# Patient Record
Sex: Male | Born: 1960 | Race: White | Hispanic: No | Marital: Married | Smoking: Current every day smoker
Health system: Southern US, Community
[De-identification: ages and names within clinical notes are randomized; demographics above are authoritative.]

---

## 2007-07-06 ENCOUNTER — Ambulatory Visit: Payer: Self-pay | Admitting: Family Medicine

## 2007-07-06 DIAGNOSIS — R030 Elevated blood-pressure reading, without diagnosis of hypertension: Secondary | ICD-10-CM

## 2007-07-06 DIAGNOSIS — M25539 Pain in unspecified wrist: Secondary | ICD-10-CM

## 2007-07-06 DIAGNOSIS — M542 Cervicalgia: Secondary | ICD-10-CM

## 2007-07-06 DIAGNOSIS — M66239 Spontaneous rupture of extensor tendons, unspecified forearm: Secondary | ICD-10-CM

## 2007-07-06 DIAGNOSIS — M66249 Spontaneous rupture of extensor tendons, unspecified hand: Secondary | ICD-10-CM

## 2009-02-20 ENCOUNTER — Ambulatory Visit: Payer: Self-pay | Admitting: Family Medicine

## 2009-02-20 DIAGNOSIS — M722 Plantar fascial fibromatosis: Secondary | ICD-10-CM

## 2010-04-16 ENCOUNTER — Ambulatory Visit: Payer: Self-pay | Admitting: Family Medicine

## 2010-04-16 DIAGNOSIS — M79609 Pain in unspecified limb: Secondary | ICD-10-CM

## 2010-04-16 DIAGNOSIS — J019 Acute sinusitis, unspecified: Secondary | ICD-10-CM

## 2010-06-26 NOTE — Assessment & Plan Note (Signed)
Summary: Sinusitis, Left arm pain   Vital Signs:  Patient profile:   50 year old male Height:      66 inches Weight:      180 pounds Temp:     98.2 degrees F oral Pulse rate:   80 / minute BP sitting:   128 / 78  (right arm) Cuff size:   regular  Vitals Entered By: Avon Gully CMA, Duncan Dull) (April 16, 2010 2:14 PM) CC: sinus congestion on and off x 6 weeks,feeling tired,cough   Primary Care Provider:  Linford Arnold, C  CC:  sinus congestion on and off x 6 weeks, feeling tired, and cough.  History of Present Illness: sinus congestion on and off x 6 weeks,feeling tired,cough. Started out with chest congestion adn chest pain but didn't really feelt that bad. Only last a day or so but since then has been sick on and off.  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  When first started felt very SOB.  Now that is better by still some chest congestion.  Then yesterday felt feverish.  Cough is productive.  NO cough meds.  Sometimes takes Allegra but not lately.  Took it early on but didn't really help. No HA. ST has resolved.     In the beginning of July he was pushing a 900 lb palette and hyperextended both arms.  Since then his left arm and elbow have been painful. He has diffuse bruising down to his forearm since this happened. He did report it to the medical person at work but they brushed it off. He thought it would get better on its own.    Current Medications (verified): 1)  Advil 200 Mg  Caps (Ibuprofen) .... Take Two Tablets By Janeece Agee Every Week  Allergies (verified): 1)  ! * Aleve 2)  Codeine  Comments:  Nurse/Medical Assistant: The patient's medications and allergies were reviewed with the patient and were updated in the Medication and Allergy Lists. Avon Gully CMA, Duncan Dull) (April 16, 2010 2:15 PM)  Social History: Reviewed history from 07/06/2007 and no changes required. 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  Partner is Tammy Crymes. Current Smoker Alcohol use-no Drug use-no Regular exercise-no  Physical Exam  General:  Well-developed,well-nourished,in no acute distress; alert,appropriate and cooperative throughout examination Head:  Normocephalic and atraumatic without obvious abnormalities. No apparent alopecia or balding. Eyes:  No corneal or conjunctival inflammation noted. EOMI. Perrla.  Ears:  External ear exam shows no significant lesions or deformities.  Otoscopic examination reveals clear canals, tympanic membranes are intact bilaterally without bulging, retraction, inflammation or discharge. Hearing is grossly normal bilaterally. Nose:  External nasal examination shows no deformity or inflammation. Nasal mucosa are pink and moist without lesions or exudates. Mouth:  Oral mucosa and oropharynx without lesions or exudates.  Teeth in good repair. Neck:  No deformities, masses, or tenderness noted. Chest Wall:  No deformities, masses, tenderness or gynecomastia noted. Lungs:  Normal respiratory effort, chest expands symmetrically. Lungs are clear to auscultation, no crackles or wheezes. Heart:  Normal rate and regular rhythm. S1 and S2 normal without gallop, murmur, click, rub or other extra sounds. Msk:  Left upper arm with a divet or gap betweent teh elbow crease and the bicep. Strength 5/5. No bruising or swelling today.   Skin:  no rashes.   Cervical Nodes:  No lymphadenopathy noted Psych:  Cognition and judgment appear intact. Alert and cooperative with normal attention span  and concentration. No apparent delusions, illusions, hallucinations   Impression & Recommendations:  Problem # 1:  SINUSITIS - ACUTE-NOS (ICD-461.9)  His updated medication list for this problem includes:    Amoxicillin 875 Mg Tabs (Amoxicillin) .Marland Kitchen... Take 1 tablet by mouth two times a day for 10 days  Instructed on treatment. Call if symptoms persist or worsen.   Problem # 2:  ARM PAIN, LEFT  (ICD-729.5) I actually think he has torn part of the bicep. I recommend he have further evaluation with [place] to confirm. Since the happened at work then he does need to be seen by workers comp if available through work. If not pt to call me back and I will make referall to sport med.   Complete Medication List: 1)  Advil 200 Mg Caps (Ibuprofen) .... Take two tablets by moutyh every week 2)  Amoxicillin 875 Mg Tabs (Amoxicillin) .... Take 1 tablet by mouth two times a day for 10 days  Patient Instructions: 1)  Call if not better in one week 2)  Call me if you want a referral for your left arm.  Prescriptions: AMOXICILLIN 875 MG TABS (AMOXICILLIN) Take 1 tablet by mouth two times a day for 10 days  #20 x 0   Entered and Authorized by:   Nani Gasser MD   Signed by:   Nani Gasser MD on 04/16/2010   Method used:   Electronically to        [facility]. [phone]* (retail)       [address]       [place], [place]  [address]       Ph: [phone]       Fax: [phone]   RxID:   [id number]    Orders Added: 1)  Est. Patient Level IV [36644]

## 2010-12-02 ENCOUNTER — Encounter: Payer: Self-pay | Admitting: Family Medicine

## 2010-12-06 ENCOUNTER — Encounter: Payer: Self-pay | Admitting: Family Medicine

## 2010-12-06 ENCOUNTER — Ambulatory Visit (INDEPENDENT_AMBULATORY_CARE_PROVIDER_SITE_OTHER): Payer: BC Managed Care – PPO | Admitting: Family Medicine

## 2010-12-06 VITALS — BP 140/86 | HR 89 | Ht 67.0 in | Wt 181.0 lb

## 2010-12-06 DIAGNOSIS — S43499A Other sprain of unspecified shoulder joint, initial encounter: Secondary | ICD-10-CM

## 2010-12-06 DIAGNOSIS — S46219A Strain of muscle, fascia and tendon of other parts of biceps, unspecified arm, initial encounter: Secondary | ICD-10-CM

## 2010-12-06 NOTE — Progress Notes (Signed)
  Subjective:    Patient ID: Brian Chan, male    DOB: 04/29/1961, 50 y.o.   MRN: [id number]  HPI  In the beginning of July he was pushing a 900 lb palette and hyperextended both arms. Since then his left arm and elbow have been painful. He has diffuse bruising down to his forearm since this happened. He did report it to the medical person at work but they brushed it off. He thought it would get better on its own. It feels asleep but not tingling in the upper arm.  If tries to open a jar it is ery difficulty. Will get cramping in his bicep. Even pulls somehting out of his pocket will have pain in his arm. Has full mobility of his arm. Not taking pain relievers.   Review of Systems     Objective:   Physical Exam    left Arm Pain- Lef shoulder and elbow and wrist with NROM.  Strength in the shoulder, elbow and hand is 5/5 bilat.  The bicpe looks rolled up into the arm.  Nontender over the shoulder and elbow.     Assessment & Plan:  Left bicpe tear - Will refer to ortho to see if amenable to surgical repair.   BP is up todate. Asked him to have ortho recheck it next week. If still elevated call and will start a BP medication.

## 2012-04-17 ENCOUNTER — Encounter: Payer: Self-pay | Admitting: Family Medicine

## 2012-04-17 ENCOUNTER — Telehealth: Payer: Self-pay | Admitting: *Deleted

## 2012-04-17 ENCOUNTER — Ambulatory Visit (INDEPENDENT_AMBULATORY_CARE_PROVIDER_SITE_OTHER): Payer: BC Managed Care – PPO | Admitting: Family Medicine

## 2012-04-17 ENCOUNTER — Ambulatory Visit (INDEPENDENT_AMBULATORY_CARE_PROVIDER_SITE_OTHER): Payer: BC Managed Care – PPO

## 2012-04-17 VITALS — BP 122/79 | HR 75 | Ht 67.0 in | Wt 168.0 lb

## 2012-04-17 DIAGNOSIS — R918 Other nonspecific abnormal finding of lung field: Secondary | ICD-10-CM

## 2012-04-17 DIAGNOSIS — R9389 Abnormal findings on diagnostic imaging of other specified body structures: Secondary | ICD-10-CM

## 2012-04-17 DIAGNOSIS — R05 Cough: Secondary | ICD-10-CM

## 2012-04-17 DIAGNOSIS — M79609 Pain in unspecified limb: Secondary | ICD-10-CM

## 2012-04-17 DIAGNOSIS — M25511 Pain in right shoulder: Secondary | ICD-10-CM

## 2012-04-17 DIAGNOSIS — M25519 Pain in unspecified shoulder: Secondary | ICD-10-CM

## 2012-04-17 DIAGNOSIS — M79646 Pain in unspecified finger(s): Secondary | ICD-10-CM

## 2012-04-17 MED ORDER — HYDROCODONE-ACETAMINOPHEN 5-325 MG PO TABS: 1.0000 | ORAL_TABLET | Freq: Two times a day (BID) | ORAL | Status: DC | PRN

## 2012-04-17 MED ORDER — MELOXICAM 15 MG PO TABS: 15.0000 mg | ORAL_TABLET | Freq: Every day | ORAL | Status: DC

## 2012-04-17 NOTE — Progress Notes (Signed)
Subjective:    Patient ID: Brian Chan, male    DOB: 01/05/1961, 51 y.o.   MRN: [id number]  HPI On a Monday work up wth scratchy throat, then next day could feel "it " in his chest. Then later that day worked in a dusty environment.  He did wear a dust mask. Then chest congestion got worse but couldn't cough anything up.  Then 6 days later went to ED because of SOB and chest discomfort. No fever. Had an xray.  Told had a spot on his left lung that he thought was infection.  Placed on an ABX  He completed course.  Says he is feeling better.  Also had a breathing tx at the ED as well and that really helped him. Still has some mild chest congestion.  Also given an albuterol inhaler.  Still no fever.   CXr done on 04/05/12. Reports non specific small opacity at the left cardiophrenic angle.   C/o of pain in his right thumb, right great toe, right shoulder and right hip.  Hx of old injury, dislocation of the right shoulder in 1989.  He is right handed.  Joints don't swell. Denies any sig stiffness in the AM.  No old injuries to the thumb. He is an Personnel officer for a living.  Says sleeps with his arm extended.  Pain when turns the steering wheel in his shoulder. When has pain in great toe, no swelling, heat or redness. He says he feels likt here is "something" in his joints.     Review of Systems     Objective:   Physical Exam  Constitutional: He is oriented to person, place, and time. He appears well-developed and well-nourished.  HENT:  Head: Normocephalic and atraumatic.  Right Ear: External ear normal.  Left Ear: External ear normal.  Nose: Nose normal.  Mouth/Throat: Oropharynx is clear and moist.       TMs and canals are clear.   Eyes: Conjunctivae normal and EOM are normal. Pupils are equal, round, and reactive to light.  Neck: Neck supple. No thyromegaly present.  Cardiovascular: Normal rate and normal heart sounds.   Pulmonary/Chest: Effort normal and breath sounds normal.    Musculoskeletal:       No swelling of the hands joints.  Mildly tender of the CMC joint.  Strength is 5/5 in the hands.  Shoulder with NROM.   Lymphadenopathy:    He has no cervical adenopathy.  Neurological: He is alert and oriented to person, place, and time.  Skin: Skin is warm and dry.  Psychiatric: He has a normal mood and affect.          Assessment & Plan:  Abnormal CXR - Will repeat chest x-ray today. There are no previous or old films for comparison. He then reports his last chest x-ray was well over 10 years ago.  Bronchitis-resolving. His exam is normal today. Recommend quit smoking.  He says he wants to quit but is not quite ready at this time.  Thumb and shoulder pain - discussed at this time his description I do not think that these are consistent with any type of rheumatologic disorder. Also does not sound consistent with gout. I suspect it's probably just osteoarthritis. Based on what he does for living it's not unusual to potentially have some significant arthritis at his age. I would like him to see my partner Dr. Benjamin Stain for further evaluation and treatment. Might even benefit from injection which would give him significant pain relief.  We have never treated his pain with narcotics that he has tried tramadol in the past. I did give him a small quantity of hydrocodone to get him through until he sees Dr. Benjamin Stain and I discussed alternative treatment regimens. I did discuss with him that narcotics is not an appropriate long-term treatment. I would also like him to try Mobic. 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 He normally takes it to 3 times a week and usually get some relief until more recently in which case he says he's taken up to 6 one time and has not gotten any relief.

## 2012-04-17 NOTE — Patient Instructions (Addendum)
Please schedule with Dr. Karie Schwalbe for your thumb and your shoulder We will call you with your xray results.

## 2012-04-17 NOTE — Telephone Encounter (Signed)
Patient notified of chest x-ray results and to f/u if not feeling better in 2 weeks Aware Rx sent to pharm

## 2012-04-20 ENCOUNTER — Telehealth: Payer: Self-pay | Admitting: *Deleted

## 2012-04-20 NOTE — Telephone Encounter (Signed)
Not sure what else to offer. We can try tramadol but I hthink he told me he had already tried it and felt it didn't really help him.

## 2012-04-20 NOTE — Telephone Encounter (Signed)
C/O reaction to  Hydrocodone- face was red and he was itching all over.  Instructed not to take anymore and wants something else sent to pharm.

## 2012-04-20 NOTE — Telephone Encounter (Signed)
Keep appt with Dr. Karie Schwalbe or schedule if needed. There are not other pain reliver taht are different from the mobic that I can offer him.

## 2012-04-20 NOTE — Telephone Encounter (Signed)
Patient states she has already tried the Tramadol and does not work for him.

## 2012-05-14 ENCOUNTER — Other Ambulatory Visit: Payer: Self-pay | Admitting: Family Medicine

## 2012-05-25 ENCOUNTER — Other Ambulatory Visit: Payer: Self-pay | Admitting: Family Medicine

## 2012-05-28 ENCOUNTER — Telehealth: Payer: Self-pay | Admitting: Family Medicine

## 2012-05-28 NOTE — Telephone Encounter (Signed)
Does patient need a referral to somewhere?? Brian Chan was saying he is ready to be scheduled for his referral???? Thanks, [employer]

## 2012-05-28 NOTE — Telephone Encounter (Signed)
Dr. Karie Schwalbe for thumb and shoulder pain.

## 2012-06-01 ENCOUNTER — Ambulatory Visit (INDEPENDENT_AMBULATORY_CARE_PROVIDER_SITE_OTHER): Payer: BC Managed Care – PPO | Admitting: Sports Medicine

## 2012-06-01 ENCOUNTER — Encounter: Payer: Self-pay | Admitting: Sports Medicine

## 2012-06-01 ENCOUNTER — Ambulatory Visit (INDEPENDENT_AMBULATORY_CARE_PROVIDER_SITE_OTHER): Payer: BC Managed Care – PPO

## 2012-06-01 VITALS — BP 122/77 | HR 73 | Ht 67.0 in | Wt 171.0 lb

## 2012-06-01 DIAGNOSIS — M25519 Pain in unspecified shoulder: Secondary | ICD-10-CM

## 2012-06-01 DIAGNOSIS — M25511 Pain in right shoulder: Secondary | ICD-10-CM | POA: Insufficient documentation

## 2012-06-01 DIAGNOSIS — M19049 Primary osteoarthritis, unspecified hand: Secondary | ICD-10-CM

## 2012-06-01 DIAGNOSIS — M1811 Unilateral primary osteoarthritis of first carpometacarpal joint, right hand: Secondary | ICD-10-CM | POA: Insufficient documentation

## 2012-06-01 MED ORDER — DICLOFENAC SODIUM 1 % TD GEL: 2.0000 g | Freq: Four times a day (QID) | TRANSDERMAL | Status: DC

## 2012-06-01 NOTE — Progress Notes (Addendum)
SPORTS MEDICINE CONSULTATION REPORT  Subjective:    I'm seeing this patient as a consultation for:  Dr. 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  Right thumb pain: Has been present for a long time, patient suspect arthritis. Has been using Mobic without much benefit. He did get some hydrocodone which provided some benefit the patient does understand that this was meant to be short term only. Pain is localized at the thumb basal joint and does not radiate. Worse with movement, better with rest. No swelling, worse when the weather is bad.  Right shoulder pain: Located over the deltoid, worse with overhead activities, waking him up from sleep, does not radiate, no numbness or tingling, moderate.  Past medical history, Surgical history, Family history, Social history, Allergies, and medications have been entered into the medical record, reviewed, and no changes needed.   Review of Systems: No headache, visual changes, nausea, vomiting, diarrhea, constipation, dizziness, abdominal pain, skin rash, fevers, chills, night sweats, weight loss, swollen lymph nodes, body aches, joint swelling, muscle aches, chest pain, shortness of breath, mood changes, visual or auditory hallucinations.   Objective:   Vitals:  Afebrile, vital signs stable. General: Well Developed, well nourished, and in no acute distress.  Neuro/Psych: Alert and oriented x3, extra-ocular muscles intact, able to move all 4 extremities.  Skin: Warm and dry, no rashes noted.  Respiratory: Not using accessory muscles, speaking in full sentences, trachea midline.  Cardiovascular: Pulses palpable, no extremity edema. Abdomen: Does not appear distended. Right Shoulder: Inspection reveals no abnormalities, atrophy or asymmetry. Palpation is normal with no tenderness over AC joint or bicipital groove. ROM is full in all  planes. Rotator cuff strength weak to abduction. Positive Neer's, Hawkin's, and empty can sign. Speeds and Yergason's tests normal. No labral pathology noted with negative Obrien's, negative clunk and good stability. Normal scapular function observed. No painful arc and no drop arm sign. No apprehension sign  Right wrist with pain at the thumb basal joint. Good movement. Negative Finkelstein's.  Impression and Recommendations:   This case required medical decision making of moderate complexity.

## 2012-06-01 NOTE — Assessment & Plan Note (Signed)
Symptoms insufficiently controlled with oral NSAIDs. He declines injection today. We'll use topical diclofenac, and obtain x-rays. He will come back to see [place] on an as-needed basis when he considers injection.

## 2012-06-01 NOTE — Assessment & Plan Note (Signed)
Symptoms are highly classic for rotator cuff syndrome. Continue meloxicam, on rehabilitation. Injection if no better.

## 2012-06-11 ENCOUNTER — Other Ambulatory Visit: Payer: Self-pay | Admitting: Family Medicine

## 2013-01-09 IMAGING — CR DG HAND COMPLETE 3+V*R*
3 series · 3 of 3 positions shown · non-contrast
Comparison: None.

CLINICAL DATA: Pain.

RIGHT HAND - COMPLETE 3+ VIEW

[view not recorded (1 of 3)]
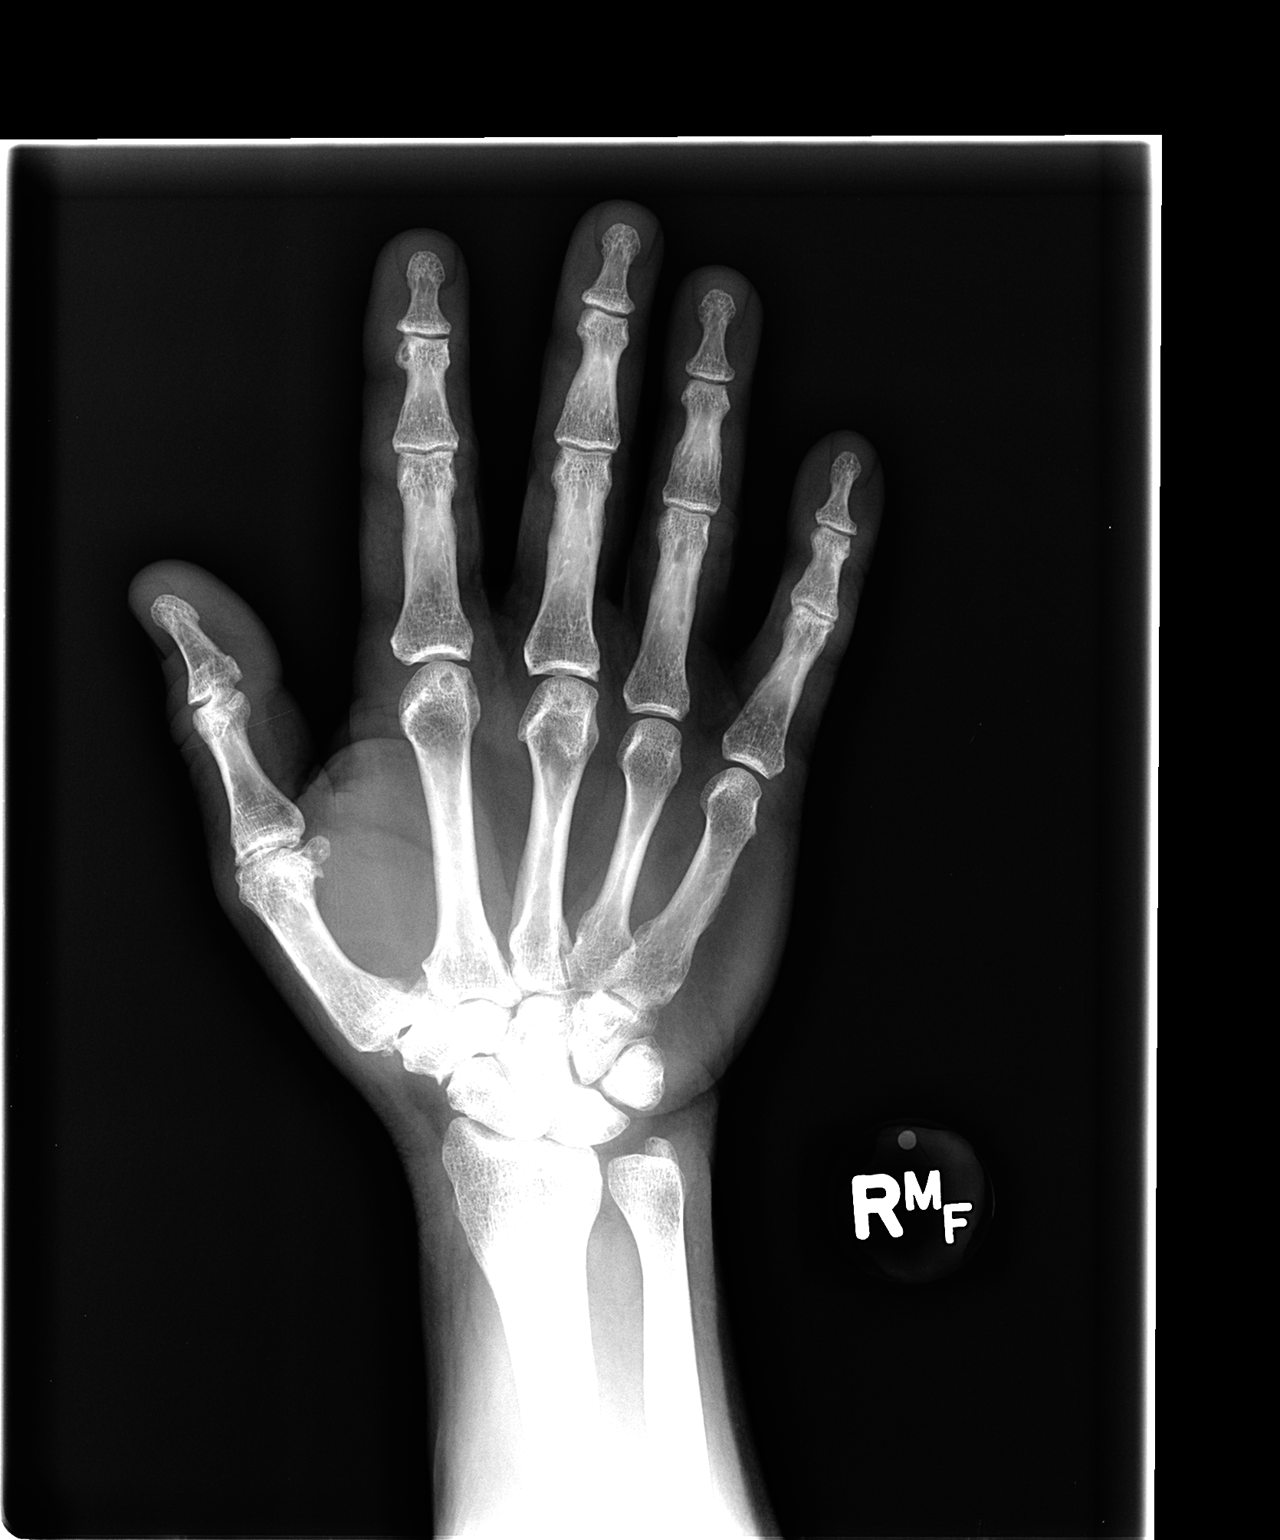

[view not recorded (2 of 3)]
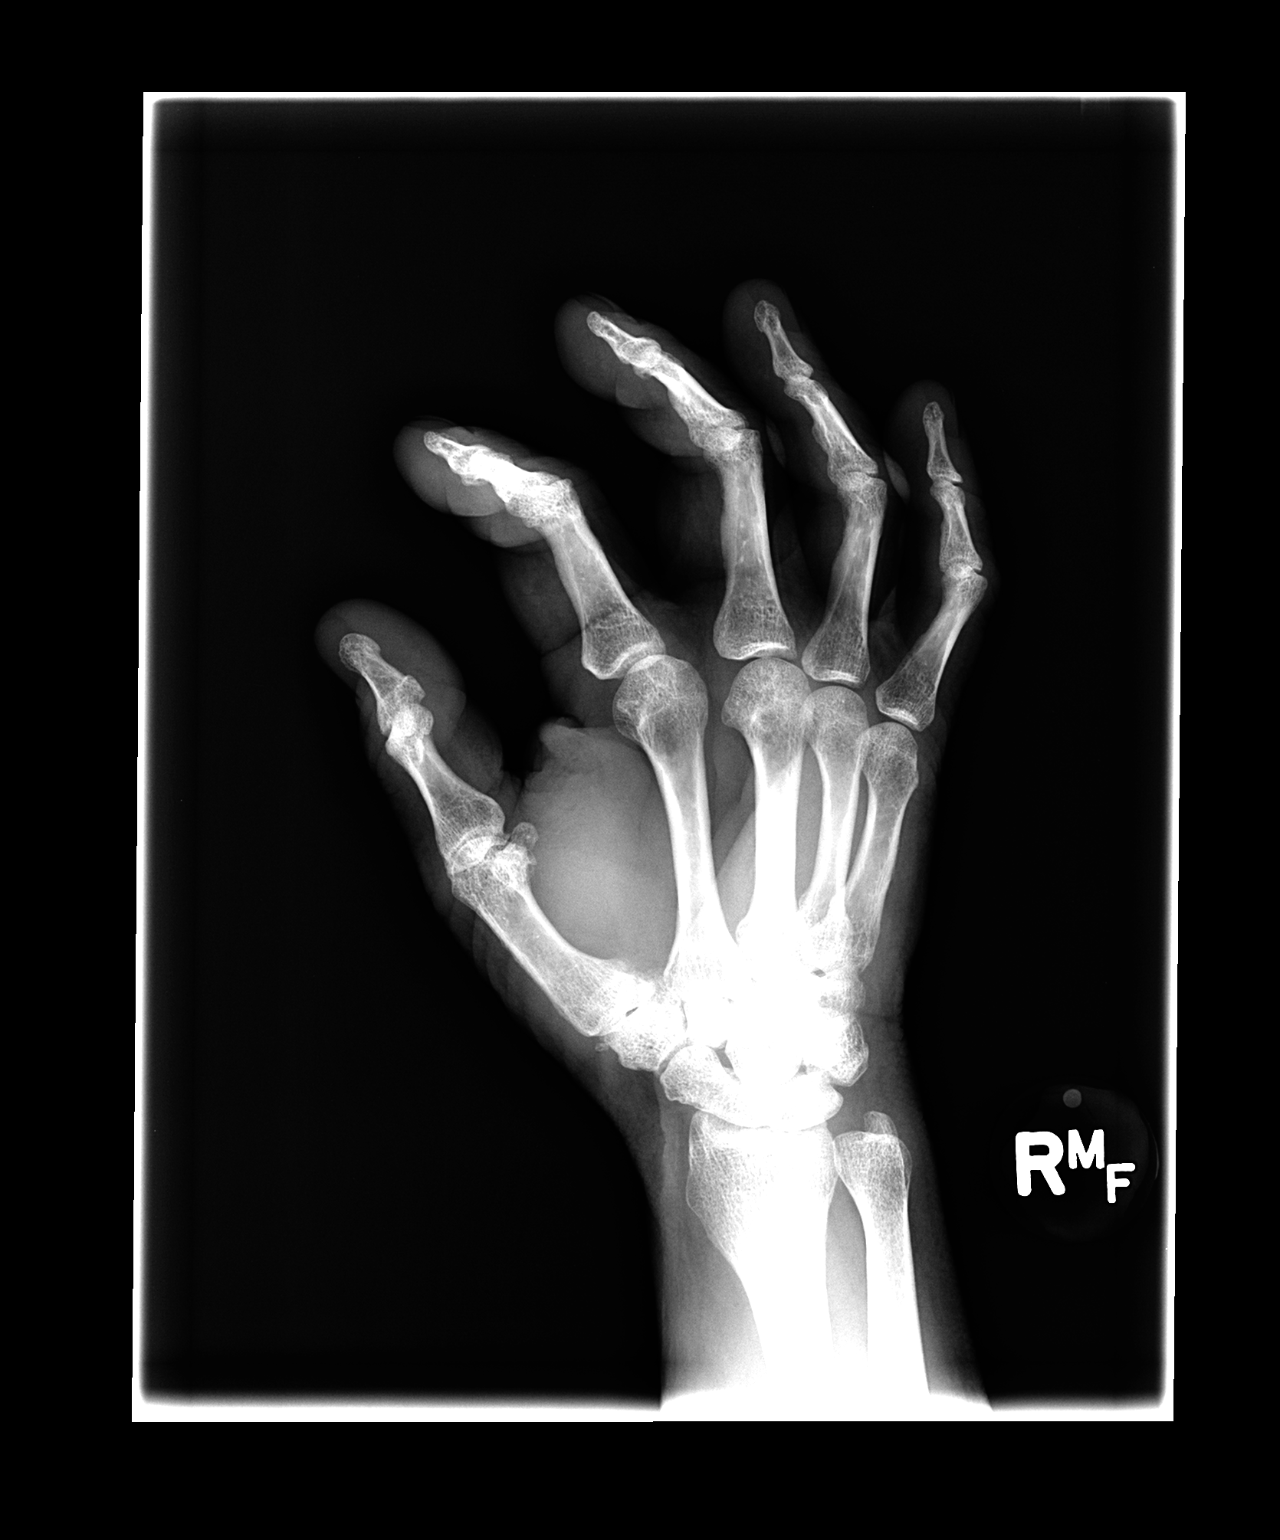

[view not recorded (3 of 3)]
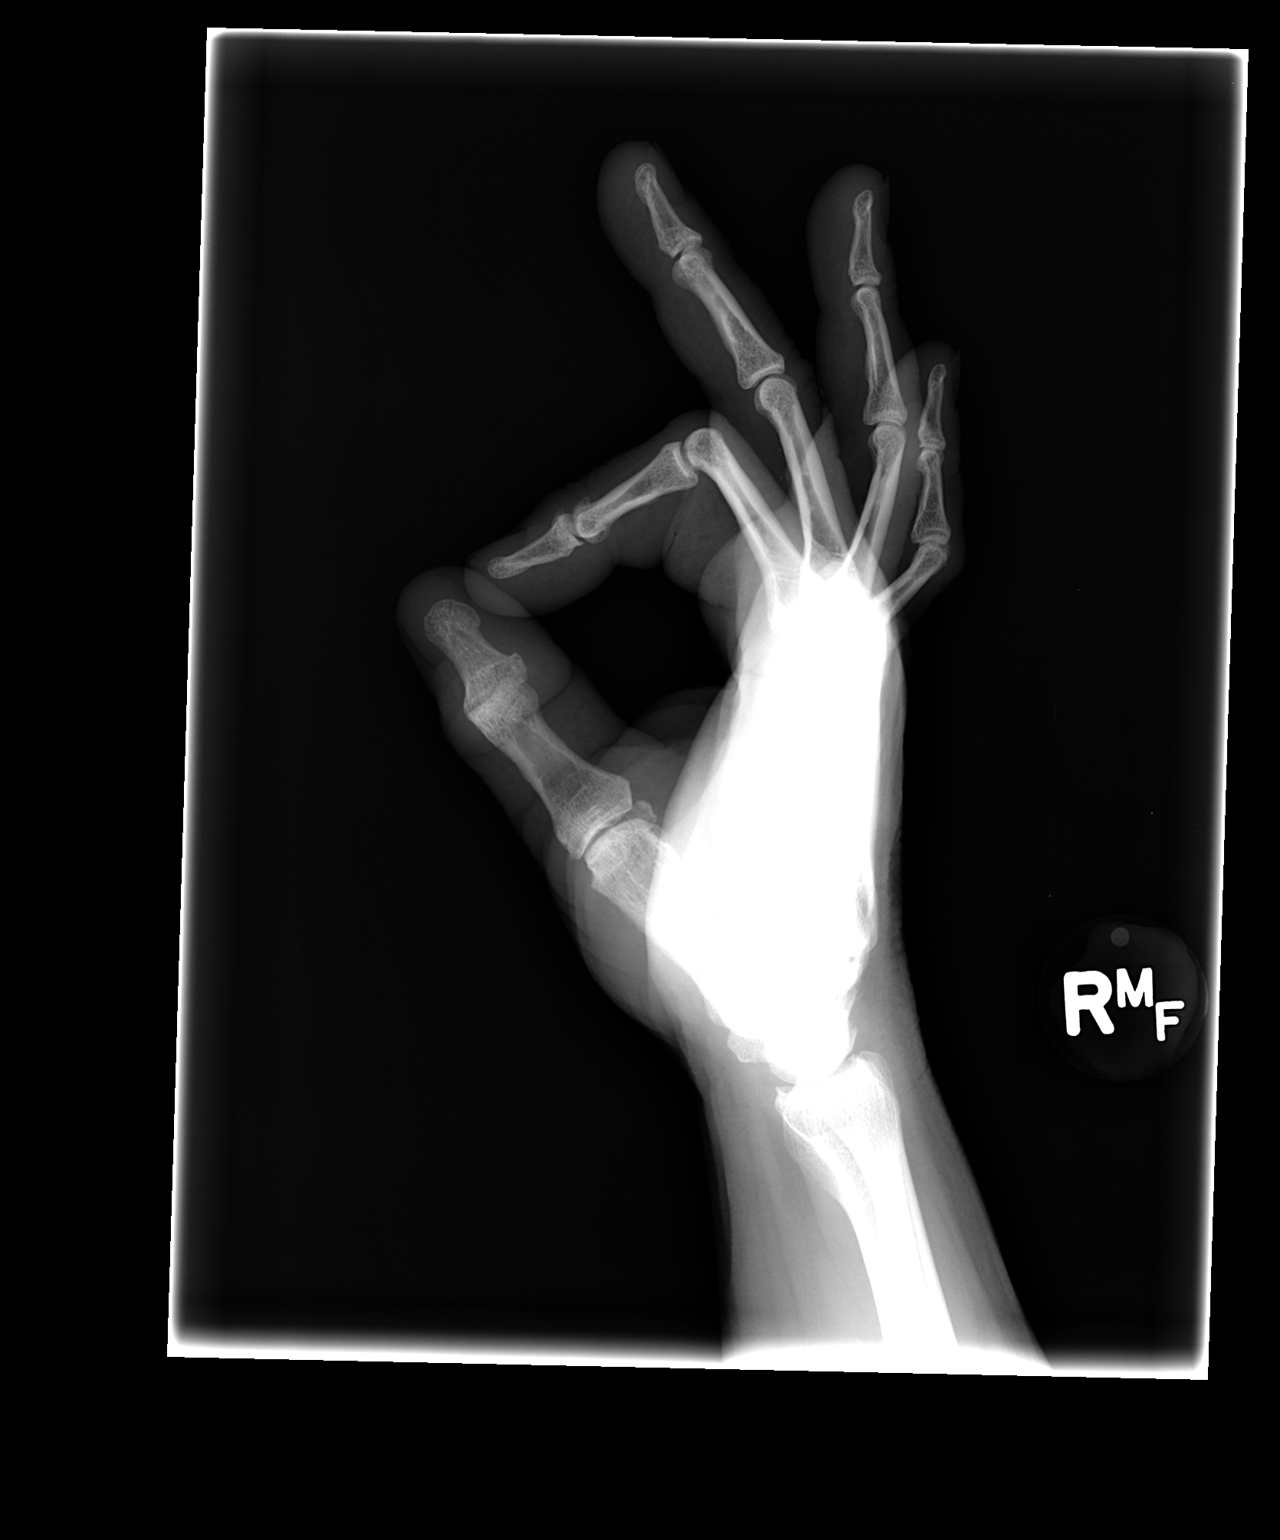

[3 of 3 positions shown; findings below may reference images not displayed]

FINDINGS: There is no acute bony or joint abnormality.  First CMC
degenerative change is noted. Degenerative disease is also seen at
the DIP joint of the index finger.  Soft tissue structures are
unremarkable.
IMPRESSION: No acute abnormality.  First CMC osteoarthritis.

## 2015-03-31 ENCOUNTER — Encounter: Payer: Self-pay | Admitting: Family Medicine

## 2015-03-31 ENCOUNTER — Ambulatory Visit (INDEPENDENT_AMBULATORY_CARE_PROVIDER_SITE_OTHER): Payer: PRIVATE HEALTH INSURANCE | Admitting: Family Medicine

## 2015-03-31 VITALS — BP 127/75 | HR 78 | Temp 98.7°F | Ht 67.0 in | Wt 167.7 lb

## 2015-03-31 DIAGNOSIS — R5383 Other fatigue: Secondary | ICD-10-CM

## 2015-03-31 DIAGNOSIS — M2550 Pain in unspecified joint: Secondary | ICD-10-CM | POA: Diagnosis not present

## 2015-03-31 LAB — COMPREHENSIVE METABOLIC PANEL
ALBUMIN: 4.2 g/dL (ref 3.6–5.1)
ALK PHOS: 92 U/L (ref 40–115)
ALT: 12 U/L (ref 9–46)
AST: 15 U/L (ref 10–35)
BILIRUBIN TOTAL: 0.5 mg/dL (ref 0.2–1.2)
BUN: 9 mg/dL (ref 7–25)
CALCIUM: 9 mg/dL (ref 8.6–10.3)
CO2: 26 mmol/L (ref 20–31)
Chloride: 103 mmol/L (ref 98–110)
Creat: 0.83 mg/dL (ref 0.70–1.33)
Glucose, Bld: 70 mg/dL (ref 65–99)
POTASSIUM: 4.2 mmol/L (ref 3.5–5.3)
SODIUM: 137 mmol/L (ref 135–146)
Total Protein: 6.7 g/dL (ref 6.1–8.1)

## 2015-03-31 LAB — CBC
HEMATOCRIT: 46 % (ref 39.0–52.0)
HEMOGLOBIN: 16 g/dL (ref 13.0–17.0)
MCH: 32.1 pg (ref 26.0–34.0)
MCHC: 34.8 g/dL (ref 30.0–36.0)
MCV: 92.2 fL (ref 78.0–100.0)
MPV: 8.9 fL (ref 8.6–12.4)
Platelets: 301 10*3/uL (ref 150–400)
RBC: 4.99 MIL/uL (ref 4.22–5.81)
RDW: 12.9 % (ref 11.5–15.5)
WBC: 16.6 10*3/uL — ABNORMAL HIGH (ref 4.0–10.5)

## 2015-03-31 LAB — CK: CK TOTAL: 84 U/L (ref 7–232)

## 2015-03-31 LAB — URIC ACID: Uric Acid, Serum: 3.8 mg/dL — ABNORMAL LOW (ref 4.0–7.8)

## 2015-03-31 LAB — TSH: TSH: 1.624 u[IU]/mL (ref 0.350–4.500)

## 2015-03-31 LAB — FERRITIN: Ferritin: 137 ng/mL (ref 22–322)

## 2015-03-31 LAB — C-REACTIVE PROTEIN: CRP: 1.6 mg/dL — AB (ref <0.60)

## 2015-03-31 MED ORDER — CYCLOBENZAPRINE HCL 10 MG PO TABS: 10.0000 mg | ORAL_TABLET | Freq: Three times a day (TID) | ORAL | Status: DC | PRN

## 2015-03-31 NOTE — Progress Notes (Signed)
Subjective:    Patient ID: Brian Chan, male    DOB: 22-Feb-1961, 54 y.o.   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 he says it is severe.  He says his collar bones are particularly painful. Taking IBU - not helping. . Pain is worse in the AM. Recently had to drill holes in a tight space.  Wrist and elbows are painful.  No URI sxs.  No family hx of RA, lupus, etc.  No fever, chills.  Hands get really stiff if drives ore than 45 minutes. Hips bother him most at night and in the evening. He says he's never had palms with his hips before until this last week. He denies any acute trauma or injury. He denies any atypical exposures to chemicals etc. at work recently.  Says when he first wakes up he is very stiff for about 1-2 hours. No swelling of the joints.  Says the last couple of nights not sleeping well.    He admits he has been more stressed financially recently. His work has cut back on over time which is really impacted his typical income and this is been somewhat stressful for him.  Review of Systems  BP 127/75 mmHg  Pulse 78  Temp(Src) 98.7 F (37.1 C)  Ht 5\' 7"  (1.702 m)  Wt 167 lb 11.2 oz (76.068 kg)  BMI 26.26 kg/m2  SpO2 100%    Allergies  Allergen Reactions  . Codeine     REACTION: itching and rash  . Naproxen Sodium     REACTION: Itching  . Hydrocodone-Acetaminophen Itching and Rash    No past medical history on file.  No past surgical history on file.  Social History   Social History  . Marital Status: Married    Spouse Name: N/A  . Number of Children: N/A  . Years of Education: N/A   Occupational History  . electrician    Social History Main Topics  . Smoking status: Current Every Day Smoker -- 1.00 packs/day    Types: Cigarettes  . Smokeless tobacco: Not on file  . Alcohol Use: No  . Drug Use: No  . Sexual Activity: Not on file   Other Topics Concern  . Not on file   Social History  Narrative    Family History  Problem Relation Age of Onset  . Cancer Mother   . Parkinsonism Father   . Heart attack      uncle  . Diabetes      grandmother    Outpatient Encounter Prescriptions as of 03/31/2015  Medication Sig  . cyclobenzaprine (FLEXERIL) 10 MG tablet Take 1 tablet (10 mg total) by mouth 3 (three) times daily as needed for muscle spasms.  . diclofenac sodium (VOLTAREN) 1 % GEL Apply 2 g topically 4 (four) times daily.  Marland Kitchen. HYDROcodone-acetaminophen (NORCO/VICODIN) 5-325 MG per tablet Take 1 tablet by mouth 2 (two) times daily as needed for pain.  Marland Kitchen. ibuprofen (ADVIL,MOTRIN) 200 MG tablet Take 400 mg by mouth every 7 (seven) days.    . meloxicam (MOBIC) 15 MG tablet TAKE 1 TABLET BY MOUTH EVERY DAY   No facility-administered encounter medications on file as of 03/31/2015.          Objective:   Physical Exam  Constitutional: He is oriented to person, place, and time. He appears well-developed and well-nourished.  HENT:  Head: Normocephalic and atraumatic.  Right Ear: External ear  normal.  Left Ear: External ear normal.  Nose: Nose normal.  Mouth/Throat: Oropharynx is clear and moist.  TMs and canals are clear.   Eyes: Conjunctivae and EOM are normal. Pupils are equal, round, and reactive to light.  Neck: Neck supple. No thyromegaly present.  Cardiovascular: Normal rate and normal heart sounds.   Pulmonary/Chest: Effort normal and breath sounds normal.  Musculoskeletal: He exhibits no edema.  Neck with normal flexion, extension, rotation right and left and side bending. Lumbar spine with normal flexion extension and rotation as well. He is nontender over the shoulders or wrists or hand joints. I don't see any distinct swelling. Strength is 5 out of 5 in the upper and lower extremities. Reflexes are 2+ in the upper and lower extremities as well. Gait is normal.  Lymphadenopathy:    He has no cervical adenopathy.  Neurological: He is alert and oriented to  person, place, and time.  Skin: Skin is warm and dry.  Psychiatric: He has a normal mood and affect.          Assessment & Plan:  Polyarthralgia-unclear etiology. I really suspect there something more going on than just plain osteoarthritis. The onset seems very abrupt and quite intense. Will check for inflammatory markers and rheumatoid arthritis with an anti-CCP though I don't see any actual swelling on exam. We'll also check uric acid level to evaluate for possible gout. Also check thyroid levels and CK. He is a little bit on the young side to have polymyalgia rheumatica but certainly that possible as well. Also check a CBC just to rule out infection. I did give him Flexeril as a trial over the weekend to see if this helps. He did take one of his wife's muscle relaxers last weekend and says it was helpful. The ibuprofen person is not really helping so he can discontinue that.

## 2015-04-01 LAB — SEDIMENTATION RATE: Sed Rate: 6 mm/hr (ref 0–20)

## 2015-04-03 LAB — CYCLIC CITRUL PEPTIDE ANTIBODY, IGG: Cyclic Citrullin Peptide Ab: <16 Units

## 2015-04-03 LAB — ANA: Anti Nuclear Antibody(ANA): NEGATIVE

## 2015-04-06 ENCOUNTER — Telehealth: Payer: Self-pay | Admitting: *Deleted

## 2015-04-06 DIAGNOSIS — M2550 Pain in unspecified joint: Secondary | ICD-10-CM

## 2015-04-06 DIAGNOSIS — R5383 Other fatigue: Secondary | ICD-10-CM

## 2015-04-06 NOTE — Telephone Encounter (Signed)
Pt called back and stated that he hasn't gotten any better although his results show everything is ok. He is still in pain and wanted to know if he should come back in. Will fwd to pcp for advice.Loralee PacasBarkley, Marysa Wessner [place]

## 2015-04-06 NOTE — Telephone Encounter (Signed)
We can recheck his CBC w/ diff and see if wbc are trending down.  Let have him make appt as well.   Let check for CMV, EBV, CBC w/ diff, TSH. b

## 2015-04-11 NOTE — Telephone Encounter (Signed)
Pt informed and will call back to schedule an appt. Labs ordered and faxed.Loralee PacasBarkley, Sajan [place]

## 2017-03-01 ENCOUNTER — Emergency Department (HOSPITAL_BASED_OUTPATIENT_CLINIC_OR_DEPARTMENT_OTHER)
Admission: EM | Admit: 2017-03-01 | Discharge: 2017-03-01 | Disposition: A | Discharge status: Home / Self Care | Payer: 59 | Attending: Emergency Medicine | Admitting: Emergency Medicine

## 2017-03-01 ENCOUNTER — Emergency Department (HOSPITAL_BASED_OUTPATIENT_CLINIC_OR_DEPARTMENT_OTHER): Payer: 59

## 2017-03-01 ENCOUNTER — Encounter (HOSPITAL_BASED_OUTPATIENT_CLINIC_OR_DEPARTMENT_OTHER): Payer: Self-pay | Admitting: Emergency Medicine

## 2017-03-01 DIAGNOSIS — R05 Cough: Secondary | ICD-10-CM | POA: Diagnosis present

## 2017-03-01 DIAGNOSIS — J209 Acute bronchitis, unspecified: Secondary | ICD-10-CM | POA: Insufficient documentation

## 2017-03-01 DIAGNOSIS — F17210 Nicotine dependence, cigarettes, uncomplicated: Secondary | ICD-10-CM | POA: Insufficient documentation

## 2017-03-01 DIAGNOSIS — J40 Bronchitis, not specified as acute or chronic: Secondary | ICD-10-CM

## 2017-03-01 LAB — BASIC METABOLIC PANEL
Anion gap: 4 — ABNORMAL LOW (ref 5–15)
BUN: 13 mg/dL (ref 6–20)
CALCIUM: 8.7 mg/dL — AB (ref 8.9–10.3)
CO2: 25 mmol/L (ref 22–32)
CREATININE: 0.89 mg/dL (ref 0.61–1.24)
Chloride: 108 mmol/L (ref 101–111)
GFR calc non Af Amer: >60 mL/min (ref >60)
Glucose, Bld: 93 mg/dL (ref 65–99)
Potassium: 4.1 mmol/L (ref 3.5–5.1)
SODIUM: 137 mmol/L (ref 135–145)

## 2017-03-01 LAB — CBC WITH DIFFERENTIAL/PLATELET
BASOS PCT: 0 %
Basophils Absolute: 0 10*3/uL (ref 0.0–0.1)
EOS ABS: 0.7 10*3/uL (ref 0.0–0.7)
Eosinophils Relative: 6 %
HCT: 43.7 % (ref 39.0–52.0)
Hemoglobin: 14.5 g/dL (ref 13.0–17.0)
Lymphocytes Relative: 29 %
Lymphs Abs: 3.4 10*3/uL (ref 0.7–4.0)
MCH: 31.6 pg (ref 26.0–34.0)
MCHC: 33.2 g/dL (ref 30.0–36.0)
MCV: 95.2 fL (ref 78.0–100.0)
MONOS PCT: 7 %
Monocytes Absolute: 0.9 10*3/uL (ref 0.1–1.0)
NEUTROS PCT: 58 %
Neutro Abs: 6.9 10*3/uL (ref 1.7–7.7)
PLATELETS: 275 10*3/uL (ref 150–400)
RBC: 4.59 MIL/uL (ref 4.22–5.81)
RDW: 13 % (ref 11.5–15.5)
WBC: 11.9 10*3/uL — AB (ref 4.0–10.5)

## 2017-03-01 LAB — TROPONIN I

## 2017-03-01 MED ORDER — AZITHROMYCIN 250 MG PO TABS: 250.0000 mg | ORAL_TABLET | Freq: Every day | ORAL | 0 refills | Status: DC

## 2017-03-01 MED ORDER — PREDNISONE 20 MG PO TABS: 20.0000 mg | ORAL_TABLET | Freq: Every day | ORAL | 0 refills | Status: AC

## 2017-03-01 MED ORDER — ALBUTEROL SULFATE HFA 108 (90 BASE) MCG/ACT IN AERS: 1.0000 | INHALATION_SPRAY | Freq: Four times a day (QID) | RESPIRATORY_TRACT | 0 refills | Status: DC | PRN

## 2017-03-01 NOTE — ED Triage Notes (Signed)
Pt c/o scratchy throat, cough, SHOB and CP over the last week.

## 2017-03-01 NOTE — ED Notes (Signed)
ED Provider at bedside. 

## 2017-03-01 NOTE — Discharge Instructions (Signed)
You have been seen in the Emergency Department (ED) today for chest pain.  As we have discussed today?s test results are normal, but you may require further testing.  I am treating you for bronchitis and encourage you to stop smoking immediately. Establish care with a PCP. I also included the phone number for a Cardiologist to follow up with if chest pain worsens.   Please follow up with the recommended doctor as instructed above in these documents regarding today?s emergent visit and your recent symptoms to discuss further management.  Continue to take your regular medications.   Return to the Emergency Department (ED) if you experience any further chest pain/pressure/tightness, difficulty breathing, or sudden sweating, or other symptoms that concern you.   Chest Pain (Nonspecific) It is often hard to give a specific diagnosis for the cause of chest pain. There is always a chance that your pain could be related to something serious, such as a heart attack or a blood clot in the lungs. You need to follow up with your health care provider for further evaluation. CAUSES  Heartburn. Pneumonia or bronchitis. Anxiety or stress. Inflammation around your heart (pericarditis) or lung (pleuritis or pleurisy). A blood clot in the lung. A collapsed lung (pneumothorax). It can develop suddenly on its own (spontaneous pneumothorax) or from trauma to the chest. Shingles infection (herpes zoster virus). The chest wall is composed of bones, muscles, and cartilage. Any of these can be the source of the pain. The bones can be bruised by injury. The muscles or cartilage can be strained by coughing or overwork. The cartilage can be affected by inflammation and become sore (costochondritis). DIAGNOSIS  Lab tests or other studies may be needed to find the cause of your pain. Your health care provider may have you take a test called an ambulatory electrocardiogram (ECG). An ECG records your heartbeat patterns over  a 24-hour period. You may also have other tests, such as: Transthoracic echocardiogram (TTE). During echocardiography, sound waves are used to evaluate how blood flows through your heart. Transesophageal echocardiogram (TEE). Cardiac monitoring. This allows your health care provider to monitor your heart rate and rhythm in real time. Holter monitor. This is a portable device that records your heartbeat and can help diagnose heart arrhythmias. It allows your health care provider to track your heart activity for several days, if needed. Stress tests by exercise or by giving medicine that makes the heart beat faster. TREATMENT  Treatment depends on what may be causing your chest pain. Treatment may include: Acid blockers for heartburn. Anti-inflammatory medicine. Pain medicine for inflammatory conditions. Antibiotics if an infection is present. You may be advised to change lifestyle habits. This includes stopping smoking and avoiding alcohol, caffeine, and chocolate. You may be advised to keep your head raised (elevated) when sleeping. This reduces the chance of acid going backward from your stomach into your esophagus. Most of the time, nonspecific chest pain will improve within 2-3 days with rest and mild pain medicine.  HOME CARE INSTRUCTIONS  If antibiotics were prescribed, take them as directed. Finish them even if you start to feel better. For the next few days, avoid physical activities that bring on chest pain. Continue physical activities as directed. Do not use any tobacco products, including cigarettes, chewing tobacco, or electronic cigarettes. Avoid drinking alcohol. Only take medicine as directed by your health care provider. Follow your health care provider's suggestions for further testing if your chest pain does not go away. Keep any follow-up appointments you  made. If you do not go to an appointment, you could develop lasting (chronic) problems with pain. If there is any problem  keeping an appointment, call to reschedule. SEEK MEDICAL CARE IF:  Your chest pain does not go away, even after treatment. You have a rash with blisters on your chest. You have a fever. SEEK IMMEDIATE MEDICAL CARE IF:  You have increased chest pain or pain that spreads to your arm, neck, jaw, back, or abdomen. You have shortness of breath. You have an increasing cough, or you cough up blood. You have severe back or abdominal pain. You feel nauseous or vomit. You have severe weakness. You faint. You have chills. This is an emergency. Do not wait to see if the pain will go away. Get medical help at once. Call your local emergency services (911 in [place] Do not drive yourself to the hospital. MAKE SURE YOU:  Understand these instructions. Will watch your condition. Will get help right away if you are not doing well or get worse. Document Released: 02/20/2005 Document Revised: 05/18/2013 Document Reviewed: 12/17/2007 [facility] Patient Information 2015 [place], [place]. This information is not intended to replace advice given to you by your health care provider. Make sure you discuss any questions you have with your health care provider.

## 2017-03-01 NOTE — ED Provider Notes (Signed)
Emergency Department Provider Note   I have reviewed the triage vital signs and the nursing notes.   HISTORY  Chief Complaint Cough   HPI Brian Chan is a 56 y.o. male with PMH of tobacco use presents to the ED for evaluation of mild scratchy throat, cough, SOB, and CP over the last 10 days. Patient states he's had some occasionally productive cough with shortness of breath. He describes an associated heaviness in his chest that occurs intermittently. At times he'll have a more sharp lateral chest wall pain. This morning his pain was worse and has resolved spontaneously. He denies any exertional or pleuritic pain. Pain does seem somewhat worse with coughing but he also has pain without coughing. He continues to smoke but has tried cutting back over the past 10 days when he has been sick.    History reviewed. No pertinent past medical history.  Patient Active Problem List   Diagnosis Date Noted  . Osteoarthritis of carpometacarpal joint of right thumb 06/01/2012  . Right shoulder pain 06/01/2012  . ARM PAIN, LEFT 04/16/2010  . PLANTAR FASCIITIS, LEFT 02/20/2009  . NECK PAIN 07/06/2007  . NONTRAUMATIC RUPTURE EXTENSOR TENDONS HAND&WRIST 07/06/2007  . ELEVATED BP READING WITHOUT DX HYPERTENSION 07/06/2007    History reviewed. No pertinent surgical history.  Current Outpatient Rx  . Order #: [id number] Class: Print  . Order #: [id number] Class: Print  . Order #: [id number] Class: Normal  . Order #: [id number] Class: Normal  . Order #: [id number] Class: Print  . Order #: [id number] Class: Historical Med  . Order #: [id number] Class: Normal  . Order #: [id number] Class: Print    Allergies Codeine; Naproxen sodium; and Hydrocodone-acetaminophen  Family History  Problem Relation Age of Onset  . Cancer Mother   . Parkinsonism Father   . Heart attack Unknown        uncle  . Diabetes Unknown        grandmother    Social History Social History  Substance Use Topics  . Smoking  status: Current Every Day Smoker    Packs/day: 1.00    Types: Cigarettes  . Smokeless tobacco: Never Used  . Alcohol use No    Review of Systems  Constitutional: No fever/chills Eyes: No visual changes. ENT: Positive sore throat. Cardiovascular: Positive chest pain. Respiratory: Positive shortness of breath and cough.  Gastrointestinal: No abdominal pain.  No nausea, no vomiting.  No diarrhea.  No constipation. Genitourinary: Negative for dysuria. Musculoskeletal: Negative for back pain. Skin: Negative for rash. Neurological: Negative for headaches, focal weakness or numbness.  10-point ROS otherwise negative.  ____________________________________________   PHYSICAL EXAM:  VITAL SIGNS: ED Triage Vitals  Enc Vitals Group     BP 03/01/17 0913 (!) 146/74     Pulse Rate 03/01/17 0913 76     Resp 03/01/17 0913 20     Temp 03/01/17 0913 97.7 F (36.5 C)     Temp Source 03/01/17 0913 Oral     SpO2 03/01/17 0913 100 %     Weight 03/01/17 0912 170 lb (77.1 kg)     Height 03/01/17 0912  (1.676 m)     Pain Score 03/01/17 0919 4   Constitutional: Alert and oriented. Well appearing and in no acute distress. Smells of tobacco smoke.  Eyes: Conjunctivae are normal. Head: Atraumatic. Nose: No congestion/rhinnorhea. Mouth/Throat: Mucous membranes are moist.  Neck: No stridor.  Cardiovascular: Normal rate, regular rhythm. Good peripheral circulation. Grossly normal heart sounds.   Respiratory: Normal respiratory effort.  No  retractions. Lungs CTAB. Gastrointestinal: Soft and nontender. No distention.  Musculoskeletal: No lower extremity tenderness nor edema. No gross deformities of extremities. Neurologic:  Normal speech and language. No gross focal neurologic deficits are appreciated.  Skin:  Skin is warm, dry and intact. No rash noted.  ____________________________________________   LABS (all labs ordered are listed, but only abnormal results are displayed)  Labs  Reviewed  BASIC METABOLIC PANEL - Abnormal; Notable for the following:       Result Value   Calcium 8.7 (*)    Anion gap 4 (*)    All other components within normal limits  CBC WITH DIFFERENTIAL/PLATELET - Abnormal; Notable for the following:    WBC 11.9 (*)    All other components within normal limits  TROPONIN I   ____________________________________________  EKG   EKG Interpretation  Date/Time:  Saturday March 01 2017 09:32:34 EDT Ventricular Rate:  77 PR Interval:    QRS Duration: 86 QT Interval:  374 QTC Calculation: 424 R Axis:   13 Text Interpretation:  Sinus rhythm Low voltage, precordial leads No STEMI. No old tracing for comparison.  Confirmed by Alona Bene [phone]) on 03/01/2017 9:34:56 AM       ____________________________________________  RADIOLOGY  Dg Chest 2 View  Result Date: 03/01/2017 CLINICAL DATA:  C/o cough, congestion, SOB x 1 wk.  Smoker EXAM: CHEST  2 VIEW COMPARISON:  04/17/2012. FINDINGS: Cardiac silhouette is normal in size and configuration. No mediastinal or hilar masses. No evidence of adenopathy. Clear lungs.  No pleural effusion or pneumothorax. Skeletal structures are intact. IMPRESSION: No active cardiopulmonary disease. Electronically Signed   By: Amie Portland M.D.   On: 03/01/2017 10:38    ____________________________________________   PROCEDURES  Procedure(s) performed:   Procedures  None ____________________________________________   INITIAL IMPRESSION / ASSESSMENT AND PLAN / ED COURSE  Pertinent labs & imaging results that were available during my care of the patient were reviewed by me and considered in my medical decision making (see chart for details).  Patient resents to the emergency room for evaluation of acute bronchitis symptoms. He is an active smoker and does not see a primary care physician regularly. He's having some associated chest heaviness with occasional sharp chest pains that are often associated with  coughing but is also having chest discomfort without coughing at times. Symptoms worse this morning but no active chest pain or shortness of breath at this time. Lower suspicion for ACS but patient does not follow with a primary care physician is a heavy smoker supplanted obtain labs including troponin, chest x-ray, and EKG as reviewed above.    I counseled the patient on smoking cessation and importance of this and limiting progression of his disease and 2 chronic bronchitis. He states he is trying to cut back.  Differential includes all life-threatening causes for chest pain. This includes but is not exclusive to acute coronary syndrome, aortic dissection, pulmonary embolism, cardiac tamponade, community-acquired pneumonia, pericarditis, musculoskeletal chest wall pain, etc.  11:00 AM On reassessment the patient has no new or worsening symptoms. X-ray and lab work are normal. EKG described above. Plan for treatment of acute bronchitis with albuterol, steroid, antibiotics with nearly 10 days of symptoms. Discussed return precautions and follow-up plan for chest pain which included establishing care with a primary care physician. Also provided contact information for local cardiology group for follow-up and consideration of outpatient stress testing.   At this time, I do not feel there is any life-threatening condition present. I  have reviewed and discussed all results (EKG, imaging, lab, urine as appropriate), exam findings with patient. I have reviewed nursing notes and appropriate previous records.  I feel the patient is safe to be discharged home without further emergent workup. Discussed usual and customary return precautions. Patient and family (if present) verbalize understanding and are comfortable with this plan.  Patient will follow-up with their primary care provider. If they do not have a primary care provider, information for follow-up has been provided to them. All questions have been  answered.  ____________________________________________  FINAL CLINICAL IMPRESSION(S) / ED DIAGNOSES  Final diagnoses:  Bronchitis     MEDICATIONS GIVEN DURING THIS VISIT:  Medications - No data to display   NEW OUTPATIENT MEDICATIONS STARTED DURING THIS VISIT:  Discharge Medication List as of 03/01/2017 10:58 AM    START taking these medications   Details  albuterol (PROVENTIL HFA;VENTOLIN HFA) 108 (90 Base) MCG/ACT inhaler Inhale 1-2 puffs into the lungs every 6 (six) hours as needed for wheezing or shortness of breath., Starting Sat 03/01/2017, Print    azithromycin (ZITHROMAX) 250 MG tablet Take 1 tablet (250 mg total) by mouth daily. Take first 2 tablets together, then 1 every day until finished., Starting Sat 03/01/2017, Print    predniSONE (DELTASONE) 20 MG tablet Take 1 tablet (20 mg total) by mouth daily., Starting Sat 03/01/2017, Until Thu 03/06/2017, Print        Note:  This document was prepared using Dragon voice recognition software and may include unintentional dictation errors.  Alona Bene, MD Emergency Medicine    Kashton Mcartor, Arlyss Repress, MD 03/01/17 1110

## 2017-09-19 ENCOUNTER — Emergency Department (HOSPITAL_BASED_OUTPATIENT_CLINIC_OR_DEPARTMENT_OTHER)
Admission: EM | Admit: 2017-09-19 | Discharge: 2017-09-19 | Disposition: A | Discharge status: Home / Self Care | Payer: 59 | Attending: Emergency Medicine | Admitting: Emergency Medicine

## 2017-09-19 ENCOUNTER — Other Ambulatory Visit: Payer: Self-pay

## 2017-09-19 ENCOUNTER — Encounter (HOSPITAL_BASED_OUTPATIENT_CLINIC_OR_DEPARTMENT_OTHER): Payer: Self-pay | Admitting: Emergency Medicine

## 2017-09-19 ENCOUNTER — Emergency Department (HOSPITAL_BASED_OUTPATIENT_CLINIC_OR_DEPARTMENT_OTHER): Payer: 59

## 2017-09-19 DIAGNOSIS — Z79899 Other long term (current) drug therapy: Secondary | ICD-10-CM | POA: Diagnosis not present

## 2017-09-19 DIAGNOSIS — R079 Chest pain, unspecified: Secondary | ICD-10-CM | POA: Diagnosis present

## 2017-09-19 DIAGNOSIS — F17210 Nicotine dependence, cigarettes, uncomplicated: Secondary | ICD-10-CM | POA: Diagnosis not present

## 2017-09-19 LAB — BASIC METABOLIC PANEL
Anion gap: 8 (ref 5–15)
BUN: 14 mg/dL (ref 6–20)
CO2: 22 mmol/L (ref 22–32)
Calcium: 9.1 mg/dL (ref 8.9–10.3)
Chloride: 107 mmol/L (ref 101–111)
Creatinine, Ser: 0.95 mg/dL (ref 0.61–1.24)
GFR calc Af Amer: >60 mL/min (ref >60)
GFR calc non Af Amer: >60 mL/min (ref >60)
Glucose, Bld: 106 mg/dL — ABNORMAL HIGH (ref 65–99)
Potassium: 4.4 mmol/L (ref 3.5–5.1)
Sodium: 137 mmol/L (ref 135–145)

## 2017-09-19 LAB — TROPONIN I: Troponin I: <0.03 ng/mL (ref <0.03)

## 2017-09-19 LAB — CBC
HCT: 44.7 % (ref 39.0–52.0)
HEMOGLOBIN: 15.6 g/dL (ref 13.0–17.0)
MCH: 32.7 pg (ref 26.0–34.0)
MCHC: 34.9 g/dL (ref 30.0–36.0)
MCV: 93.7 fL (ref 78.0–100.0)
PLATELETS: 269 10*3/uL (ref 150–400)
RBC: 4.77 MIL/uL (ref 4.22–5.81)
RDW: 12.9 % (ref 11.5–15.5)
WBC: 15.5 10*3/uL — ABNORMAL HIGH (ref 4.0–10.5)

## 2017-09-19 MED ORDER — ASPIRIN 81 MG PO CHEW
324.0000 mg | CHEWABLE_TABLET | Freq: Once | ORAL | Status: AC
  Administered 2017-09-19: 324 mg via ORAL
  Filled 2017-09-19: qty 4

## 2017-09-19 NOTE — ED Provider Notes (Signed)
[facility] EMERGENCY DEPARTMENT Provider Note   CSN: [id number] Arrival date & time: 09/19/17  9562     History   Chief Complaint Chief Complaint  Patient presents with  . Chest Pain    HPI Brian Chan is a 57 y.o. male.  He presents to the ED with 7 out of 10 substernal and left-sided chest pain that its sharp that started about 2 PM yesterday.  He attributes the pain to a fall he sustained about a week ago where he struck his left anterior chest.  He states his been a little sore but increased greatly yesterday.  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  He denies any diaphoresis nausea vomiting headache abdominal pain.  It does not radiate anywhere.  Is a little associated with some shortness of breath that he feels his his seasonal allergies.  He states he has no medical problems but he also does not go see a doctor.  He is a smoker.  The history is provided by the patient.  Chest Pain   This is a new problem. The current episode started yesterday. The problem occurs constantly. The problem has not changed since onset.The pain is associated with raising an arm, movement, exertion, lifting and breathing. The pain is present in the substernal region. The pain is at a severity of 7/10. The quality of the pain is described as sharp. The pain does not radiate. Duration of episode(s) is 17 hours. Associated symptoms include shortness of breath. Pertinent negatives include no abdominal pain, no back pain, no cough, no diaphoresis, no dizziness, no fever, no hemoptysis, no nausea, no palpitations, no sputum production, no syncope and no vomiting. He has tried rest for the symptoms. The treatment provided no relief. Risk factors include male gender and smoking/tobacco exposure.  Pertinent negatives for past medical history include no seizures.    History reviewed. No pertinent past medical history.  Patient Active Problem List   Diagnosis Date  Noted  . Osteoarthritis of carpometacarpal joint of right thumb 06/01/2012  . Right shoulder pain 06/01/2012  . ARM PAIN, LEFT 04/16/2010  . PLANTAR FASCIITIS, LEFT 02/20/2009  . NECK PAIN 07/06/2007  . NONTRAUMATIC RUPTURE EXTENSOR TENDONS HAND&WRIST 07/06/2007  . ELEVATED BP READING WITHOUT DX HYPERTENSION 07/06/2007    History reviewed. No pertinent surgical history.      Home Medications    Prior to Admission medications   Medication Sig Start Date End Date Taking? Authorizing Provider  ibuprofen (ADVIL,MOTRIN) 200 MG tablet Take 400 mg by mouth as needed.    Yes [provider]    Family History Family History  Problem Relation Age of Onset  . Cancer Mother   . Parkinsonism Father   . Heart attack Unknown        uncle  . Diabetes Unknown        grandmother    Social History Social History   Tobacco Use  . Smoking status: Current Every Day Smoker    Packs/day: 1.00    Types: Cigarettes  . Smokeless tobacco: Never Used  Substance Use Topics  . Alcohol use: No  . Drug use: No     Allergies   Codeine and Naproxen sodium   Review of Systems Review of Systems  Constitutional: Negative for chills, diaphoresis and fever.  HENT: Negative for ear pain and sore throat.   Eyes: Negative for pain and visual disturbance.  Respiratory: Positive for shortness  of breath. Negative for cough, hemoptysis and sputum production.   Cardiovascular: Positive for chest pain. Negative for palpitations and syncope.  Gastrointestinal: Negative for abdominal pain, nausea and vomiting.  Genitourinary: Negative for dysuria and hematuria.  Musculoskeletal: Positive for arthralgias. Negative for back pain and joint swelling.  Skin: Negative for color change and rash.  Neurological: Negative for dizziness, seizures and syncope.  All other systems reviewed and are negative.    Physical Exam Updated Vital Signs BP 140/89 (BP Location: Right Arm)   Pulse 72   Temp 98.1  F (36.7 C) (Oral)   Resp 20   Ht 5\' 7"  (1.702 m)   Wt 77.1 kg (170 lb)   SpO2 99%   BMI 26.63 kg/m   Physical Exam  Constitutional: He appears well-developed and well-nourished.  HENT:  Head: Normocephalic and atraumatic.  Eyes: Conjunctivae are normal.  Neck: Neck supple.  Cardiovascular: Normal rate and regular rhythm.  No murmur heard. Pulmonary/Chest: Effort normal and breath sounds normal. No respiratory distress.  Abdominal: Soft. There is no tenderness.  Musculoskeletal: Normal range of motion. He exhibits no edema.       Right lower leg: Normal. He exhibits no tenderness and no edema.       Left lower leg: Normal. He exhibits no tenderness and no edema.  Neurological: He is alert.  Skin: Skin is warm and dry. Capillary refill takes less than 2 seconds.  Psychiatric: He has a normal mood and affect.  Nursing note and vitals reviewed.    ED Treatments / Results  Labs (all labs ordered are listed, but only abnormal results are displayed) Labs Reviewed  BASIC METABOLIC PANEL - Abnormal; Notable for the following components:      Result Value   Glucose, Bld 106 (*)    All other components within normal limits  CBC - Abnormal; Notable for the following components:   WBC 15.5 (*)    All other components within normal limits  TROPONIN I    EKG EKG Interpretation  Date/Time:  Friday September 19 2017 07:01:17 EDT Ventricular Rate:  75 PR Interval:    QRS Duration: 91 QT Interval:  374 QTC Calculation: 418 R Axis:   10 Text Interpretation:  Sinus rhythm no acute st/ts similar to prior 10/18 Confirmed by Meridee Score [phone]) on 09/19/2017 7:15:36 AM Also confirmed by Meridee Score [phone]), editor Lanae Boast [phone])  on 09/19/2017 7:55:39 AM   Radiology Dg Chest 2 View  Result Date: 09/19/2017 CLINICAL DATA:  Chest pain. EXAM: CHEST - 2 VIEW COMPARISON:  03/01/2017 FINDINGS: The heart size and mediastinal contours are within normal limits. Calcified lymph  node in the aortopulmonary window. Both lungs are clear. No effusions. Slight thoracolumbar scoliosis. IMPRESSION: No active cardiopulmonary disease. Electronically Signed   By: Francene Boyers M.D.   On: 09/19/2017 07:47    Procedures Procedures (including critical care time)  Medications Ordered in ED Medications  aspirin chewable tablet 324 mg (has no administration in time range)     Initial Impression / Assessment and Plan / ED Course  I have reviewed the triage vital signs and the nursing notes.  Pertinent labs & imaging results that were available during my care of the patient were reviewed by me and considered in my medical decision making (see chart for details).  Clinical Course as of Sep 21 1731  Fri Sep 19, 2017  3267 57 year old male with history of smoking here with anterior chest pain in setting of a fall  last week.  Patient does not have any reproducible chest pain and no ecchymosis or crepitus on the chest wall.  Differential still includes musculoskeletal, ACS, pneumothorax, pneumonia, GERD.  His initial EKG was benign and he is getting screening labs and a chest x-ray.   [MB]  [phone] Patient's chest x-ray and lab work including troponin were unremarkable other than a white count of 15.  He definitely winces when he coughs and I think this is most likely musculoskeletal pain.  I do not feel he needs a second troponin as the pain has been going on for 15 hours plus.   [MB]    Clinical Course User Index [MB] Terrilee FilesButler, Michael C, MD    Final Clinical Impressions(s) / ED Diagnoses   Final diagnoses:  Nonspecific chest pain    ED Discharge Orders    None       Terrilee FilesButler, Michael C, MD 09/20/17 1733

## 2017-09-19 NOTE — Discharge Instructions (Addendum)
Your evaluated in the emergency department for chest pain.  Your chest x-ray EKG and blood work did not show an obvious cause of your pain.  It is most likely muscular and you should try over-the-counter ibuprofen or Tylenol for pain.  Please return to the emergency department if your symptoms worsen.  You should follow-up with your primary care doctor.

## 2017-09-19 NOTE — ED Notes (Signed)
Patient transported to X-ray 

## 2017-09-19 NOTE — ED Triage Notes (Signed)
Pt c/o chest pain since yesterday at 1400. Pt also states he fell x 1 week ago and injured anterior ribs.

## 2018-03-02 ENCOUNTER — Other Ambulatory Visit: Payer: Self-pay

## 2018-03-02 ENCOUNTER — Emergency Department (HOSPITAL_BASED_OUTPATIENT_CLINIC_OR_DEPARTMENT_OTHER)
Admission: EM | Admit: 2018-03-02 | Discharge: 2018-03-02 | Disposition: A | Discharge status: Home / Self Care | Payer: 59 | Attending: Emergency Medicine | Admitting: Emergency Medicine

## 2018-03-02 ENCOUNTER — Encounter (HOSPITAL_BASED_OUTPATIENT_CLINIC_OR_DEPARTMENT_OTHER): Payer: Self-pay | Admitting: *Deleted

## 2018-03-02 DIAGNOSIS — F17210 Nicotine dependence, cigarettes, uncomplicated: Secondary | ICD-10-CM | POA: Insufficient documentation

## 2018-03-02 DIAGNOSIS — J0101 Acute recurrent maxillary sinusitis: Secondary | ICD-10-CM | POA: Diagnosis not present

## 2018-03-02 DIAGNOSIS — R0981 Nasal congestion: Secondary | ICD-10-CM | POA: Diagnosis present

## 2018-03-02 LAB — CBC WITH DIFFERENTIAL/PLATELET
Basophils Absolute: 0.1 10*3/uL (ref 0.0–0.1)
Basophils Relative: 0 %
EOS ABS: 0.7 10*3/uL (ref 0.0–0.7)
Eosinophils Relative: 5 %
HEMATOCRIT: 44.2 % (ref 39.0–52.0)
HEMOGLOBIN: 15.1 g/dL (ref 13.0–17.0)
LYMPHS ABS: 3.4 10*3/uL (ref 0.7–4.0)
Lymphocytes Relative: 24 %
MCH: 32.1 pg (ref 26.0–34.0)
MCHC: 34.2 g/dL (ref 30.0–36.0)
MCV: 93.8 fL (ref 78.0–100.0)
MONOS PCT: 9 %
Monocytes Absolute: 1.3 10*3/uL — ABNORMAL HIGH (ref 0.1–1.0)
NEUTROS ABS: 8.7 10*3/uL — AB (ref 1.7–7.7)
NEUTROS PCT: 62 %
Platelets: 240 10*3/uL (ref 150–400)
RBC: 4.71 MIL/uL (ref 4.22–5.81)
RDW: 12.4 % (ref 11.5–15.5)
WBC: 14 10*3/uL — AB (ref 4.0–10.5)

## 2018-03-02 MED ORDER — FLUTICASONE PROPIONATE 50 MCG/ACT NA SUSP: 2.0000 | Freq: Every day | NASAL | 0 refills | Status: AC

## 2018-03-02 MED ORDER — AMOXICILLIN-POT CLAVULANATE 875-125 MG PO TABS: 1.0000 | ORAL_TABLET | Freq: Two times a day (BID) | ORAL | 0 refills | Status: AC

## 2018-03-02 NOTE — ED Triage Notes (Signed)
Congestion, runny nose and swollen glands for a couple of weeks. He has an enlarged lymph node in his right groin.

## 2018-03-02 NOTE — ED Notes (Signed)
Head congestion for several weeks. States right groin area swollen . Non painful

## 2018-03-02 NOTE — Discharge Instructions (Addendum)
As we discussed, it is very important follow-up with your doctor for repeat exams and lab work.  Take the antibiotics and use the Flonase as prescribed.  It is important that you take this daily to help with your sinus congestion.  Try to minimize her smoking.  If you notice any new areas of lymph node swelling or pain, follow-up with your doctor immediately.

## 2018-03-02 NOTE — ED Provider Notes (Signed)
[facility] EMERGENCY DEPARTMENT Provider Note   CSN: [id number] Arrival date & time: 03/02/18  1837     History   Chief Complaint Chief Complaint  Patient presents with  . Lymphadenopathy    HPI Brian Chan is a 57 y.o. male.  HPI   57 year old male here with multiple complaints.  Patient states that for the last 2 weeks, he has had sinus congestion and generalized fatigue.  He is noticed nasal drainage and yellow-green discharge.  He has had a mild frontal headache.  He felt like he has had enlarged lymph nodes in his bilateral anterior neck, though these are improving.  Patient has a history of previous sinus infections and currently smokes as well as works in a warehouse with significant amount of fumes.  Patient also states that he noticed a area of mild pain and swelling in his right groin.  Denies any associated urinary symptoms.  Is not had any abdominal pain, nausea, vomiting, or diarrhea.  He became concerned about an enlarged lymph node given his smoking history and subsequent presents for evaluation.  His headache is mild, throbbing, aching, and worse with bending forward.  No alleviating factors.  No known fevers.  No weight loss.  History reviewed. No pertinent past medical history.  Patient Active Problem List   Diagnosis Date Noted  . Osteoarthritis of carpometacarpal joint of right thumb 06/01/2012  . Right shoulder pain 06/01/2012  . ARM PAIN, LEFT 04/16/2010  . PLANTAR FASCIITIS, LEFT 02/20/2009  . NECK PAIN 07/06/2007  . NONTRAUMATIC RUPTURE EXTENSOR TENDONS HAND&WRIST 07/06/2007  . ELEVATED BP READING WITHOUT DX HYPERTENSION 07/06/2007    History reviewed. No pertinent surgical history.      Home Medications    Prior to Admission medications   Medication Sig Start Date End Date Taking? Authorizing Provider  ibuprofen (ADVIL,MOTRIN) 200 MG tablet Take 400 mg by mouth as needed.     [provider]    Family History Family  History  Problem Relation Age of Onset  . Cancer Mother   . Parkinsonism Father   . Heart attack Unknown        uncle  . Diabetes Unknown        grandmother    Social History Social History   Tobacco Use  . Smoking status: Current Every Day Smoker    Packs/day: 1.00    Types: Cigarettes  . Smokeless tobacco: Never Used  Substance Use Topics  . Alcohol use: No  . Drug use: No     Allergies   Codeine and Naproxen sodium   Review of Systems Review of Systems  Constitutional: Positive for fatigue. Negative for chills and fever.  HENT: Positive for congestion, sinus pressure, sinus pain and sore throat. Negative for rhinorrhea and trouble swallowing.   Eyes: Negative for visual disturbance.  Respiratory: Negative for cough, shortness of breath and wheezing.   Cardiovascular: Negative for chest pain and leg swelling.  Gastrointestinal: Negative for abdominal pain, diarrhea, nausea and vomiting.  Genitourinary: Negative for dysuria and flank pain.  Musculoskeletal: Negative for neck pain and neck stiffness.  Skin: Negative for rash and wound.  Allergic/Immunologic: Negative for immunocompromised state.  Neurological: Positive for weakness. Negative for syncope and headaches.  All other systems reviewed and are negative.    Physical Exam Updated Vital Signs BP 132/88 (BP Location: Right Arm)   Pulse 95   Temp 98.9 F (37.2 C) (Oral)   Resp 20   Ht 5\' 7"  (1.702  m)   Wt 77.1 kg   SpO2 99%   BMI 26.63 kg/m   Physical Exam  Constitutional: He is oriented to person, place, and time. He appears well-developed and well-nourished. No distress.  HENT:  Head: Normocephalic and atraumatic.  Right Ear: Tympanic membrane is bulging.  Left Ear: Tympanic membrane is bulging.  Nose: Mucosal edema and rhinorrhea present. Right sinus exhibits maxillary sinus tenderness. Left sinus exhibits maxillary sinus tenderness.  Mouth/Throat: Mucous membranes are normal. Posterior  oropharyngeal erythema present.  Eyes: Conjunctivae are normal.  Neck: Neck supple.  Cardiovascular: Normal rate, regular rhythm and normal heart sounds. Exam reveals no friction rub.  No murmur heard. Pulmonary/Chest: Effort normal and breath sounds normal. No respiratory distress. He has no wheezes. He has no rales.  Abdominal: Soft. Bowel sounds are normal. He exhibits no distension. There is no tenderness. There is no rebound and no guarding.  Musculoskeletal: He exhibits no edema.  Neurological: He is alert and oriented to person, place, and time. He exhibits normal muscle tone.  Skin: Skin is warm. Capillary refill takes less than 2 seconds.  Single mobile, round, non-tender R inguinal lymph node. No skin lesions b/l LE. No L inguinal LAD. No supraclavicualr or other LAD.  Psychiatric: He has a normal mood and affect.  Nursing note and vitals reviewed.    ED Treatments / Results  Labs (all labs ordered are listed, but only abnormal results are displayed) Labs Reviewed  CBC WITH DIFFERENTIAL/PLATELET - Abnormal; Notable for the following components:      Result Value   WBC 14.0 (*)    Neutro Abs 8.7 (*)    Monocytes Absolute 1.3 (*)    All other components within normal limits    EKG None  Radiology No results found.  Procedures Procedures (including critical care time)  Medications Ordered in ED Medications - No data to display   Initial Impression / Assessment and Plan / ED Course  I have reviewed the triage vital signs and the nursing notes.  Pertinent labs & imaging results that were available during my care of the patient were reviewed by me and considered in my medical decision making (see chart for details).     56 year old male here with sinus congestion and anterior cervical lymphadenopathy.  Exam and history is consistent with recurrent, likely acute bacterial sinusitis with reactive lymphadenopathy.  Patient does have a small, minimally tender, mobile,  right inguinal lymph node.  This is directly in the area where he wears his work belt and harness.  He is unsure how long this is been there.  He has no other left inguinal, axillary, supraclavicular, or other lymphadenopathy.  No weight loss, night sweats, or other symptoms to suggest malignancy.  His CBC shows a moderate leukocytosis which is his baseline and he has a normal differential with no atypical cells.  I suspect this is likely reactive secondary to his harness as well as possible sinusitis, but discussed that given a single time point and lab, cannot rule out underlying malignancy.  I advised him to take the medications for his sinusitis, continue to monitor for any new lymph nodes, and follow-up with a PCP in the next 1 to 2 months for repeat labs and checkup.  Return precautions given.  Final Clinical Impressions(s) / ED Diagnoses   Final diagnoses:  Acute recurrent maxillary sinusitis    ED Discharge Orders    None       Shaune Pollack, MD 03/02/18 2019

## 2018-04-29 IMAGING — CR DG CHEST 2V
2 series · 2 of 2 positions shown · non-contrast
Comparison: 03/01/2017

CLINICAL DATA: Chest pain.

EXAM:
CHEST - 2 VIEW

[w chest pa]
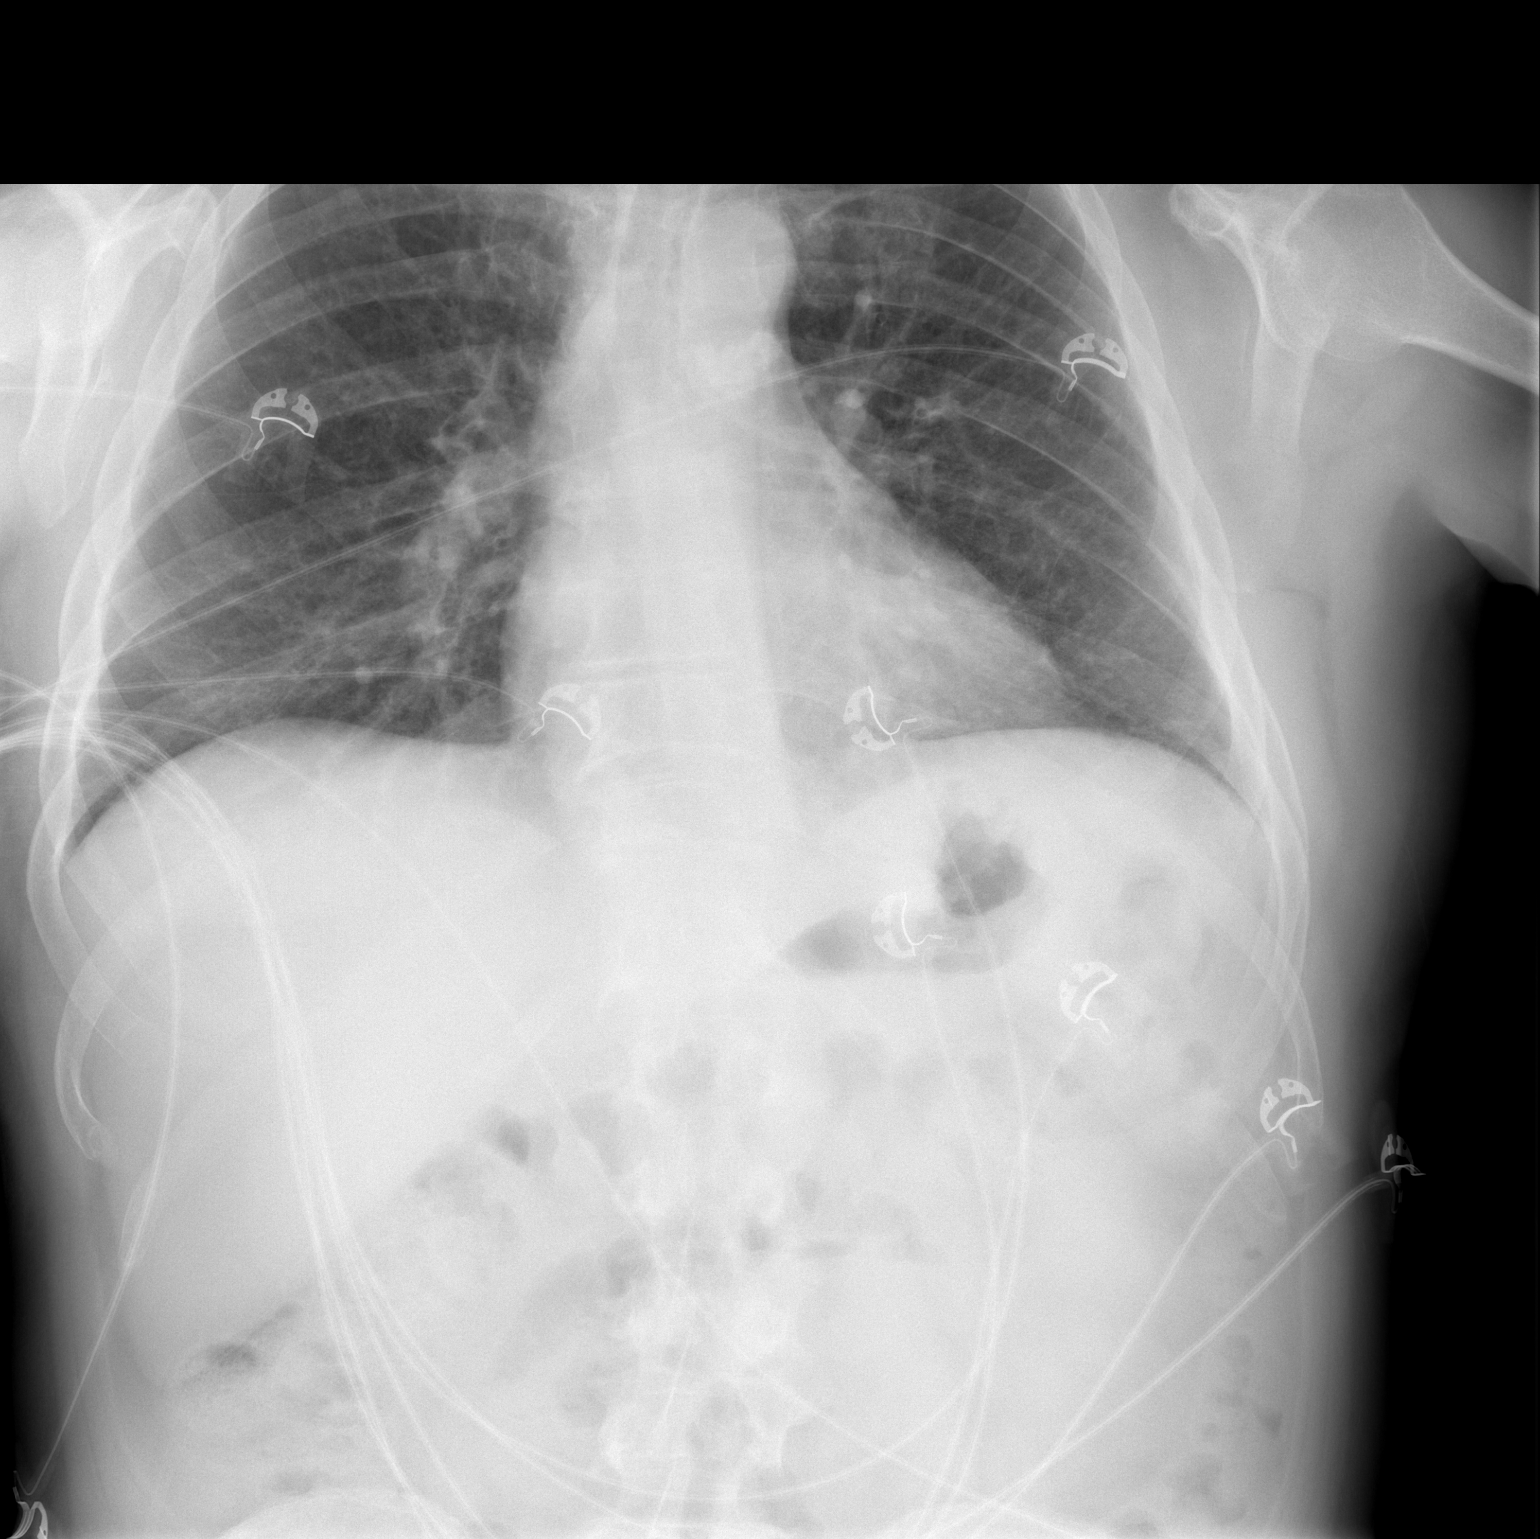

[w chest lat]
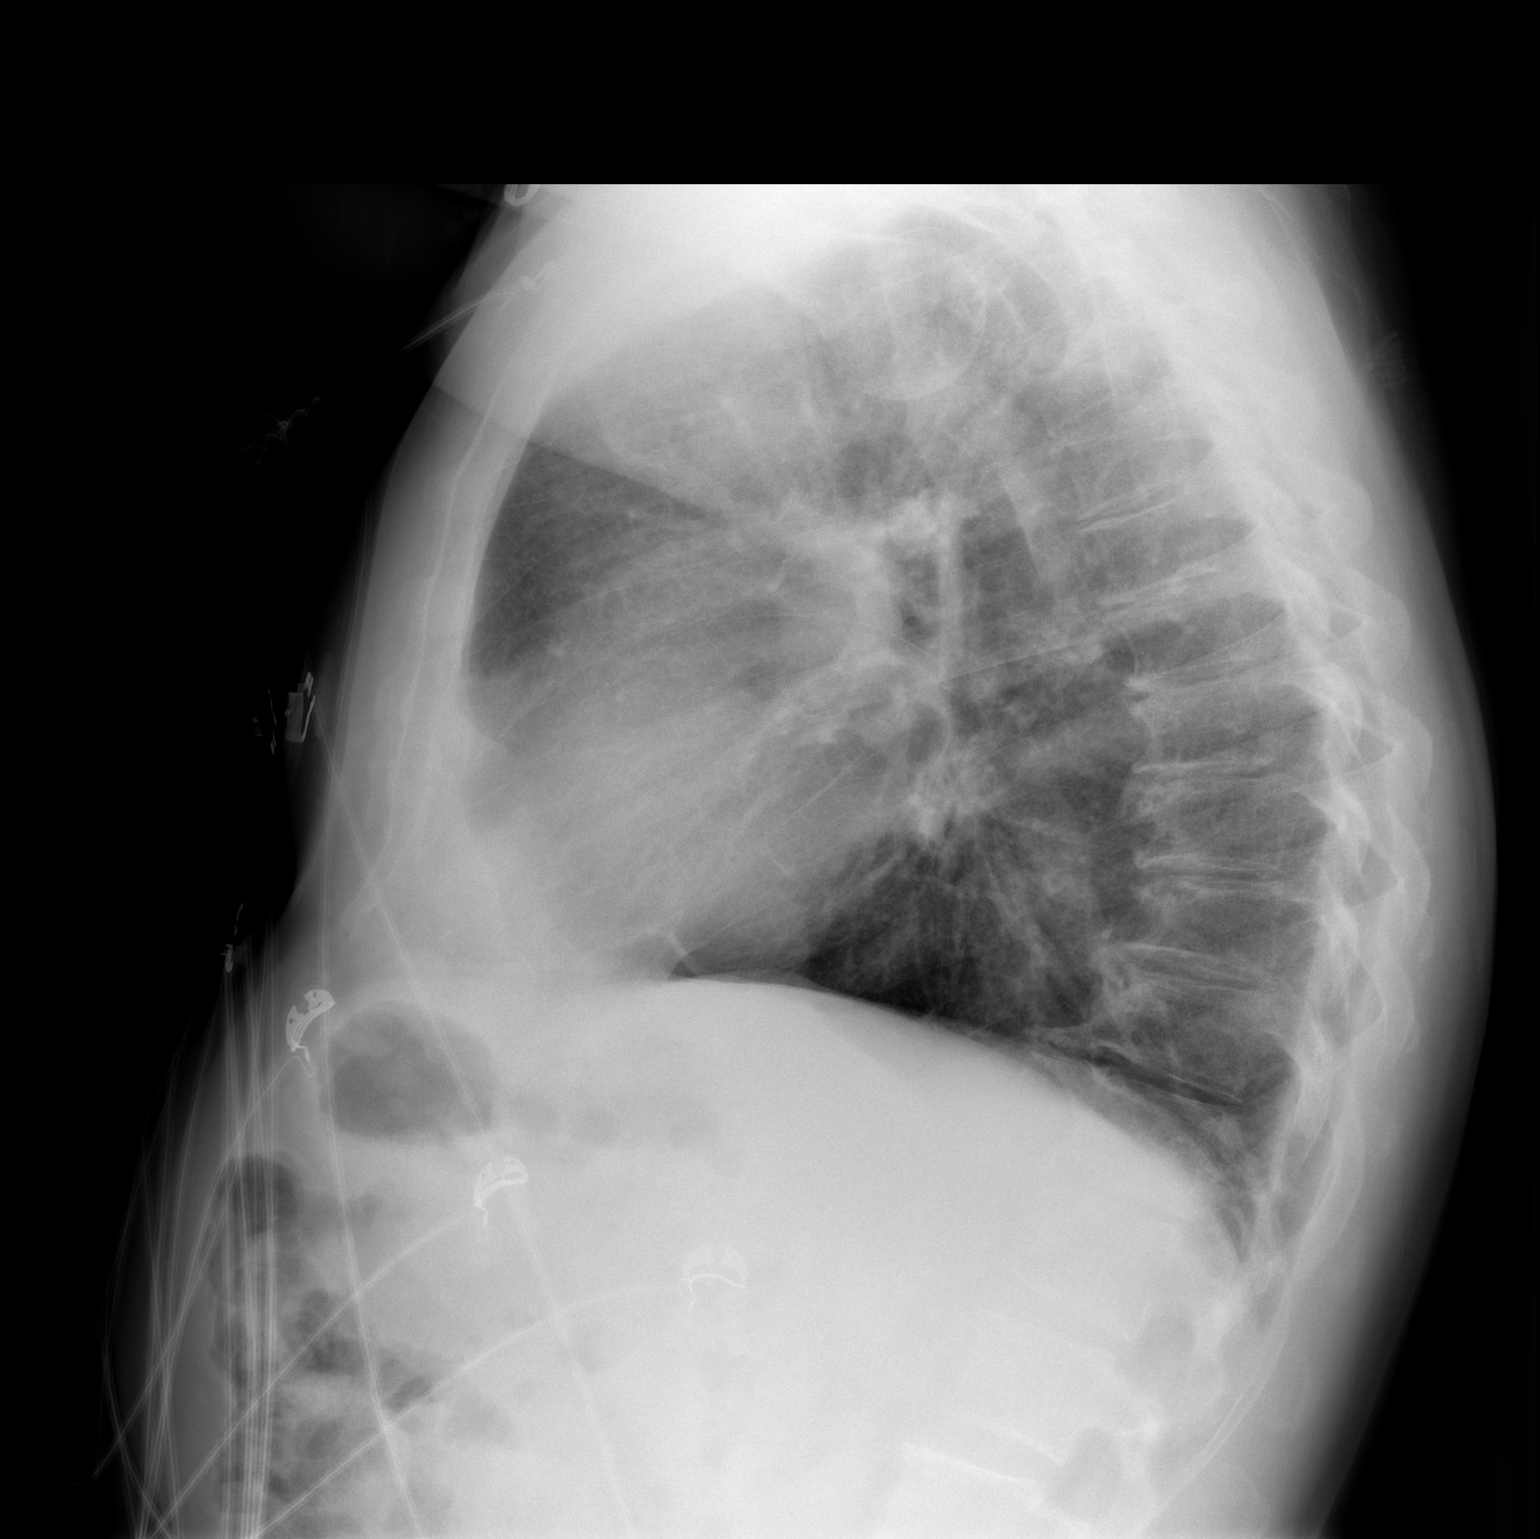

[2 of 2 positions shown; findings below may reference images not displayed]

FINDINGS: The heart size and mediastinal contours are within normal limits.
Calcified lymph node in the aortopulmonary window. Both lungs are
clear. No effusions. Slight thoracolumbar scoliosis.
IMPRESSION: No active cardiopulmonary disease.

## 2018-06-25 ENCOUNTER — Emergency Department (HOSPITAL_BASED_OUTPATIENT_CLINIC_OR_DEPARTMENT_OTHER): Payer: 59

## 2018-06-25 ENCOUNTER — Encounter (HOSPITAL_BASED_OUTPATIENT_CLINIC_OR_DEPARTMENT_OTHER): Payer: Self-pay

## 2018-06-25 ENCOUNTER — Other Ambulatory Visit: Payer: Self-pay

## 2018-06-25 ENCOUNTER — Emergency Department (HOSPITAL_BASED_OUTPATIENT_CLINIC_OR_DEPARTMENT_OTHER)
Admission: EM | Admit: 2018-06-25 | Discharge: 2018-06-25 | Disposition: A | Discharge status: Home / Self Care | Payer: 59 | Attending: Emergency Medicine | Admitting: Emergency Medicine

## 2018-06-25 DIAGNOSIS — F17210 Nicotine dependence, cigarettes, uncomplicated: Secondary | ICD-10-CM | POA: Diagnosis not present

## 2018-06-25 DIAGNOSIS — Z720 Tobacco use: Secondary | ICD-10-CM

## 2018-06-25 DIAGNOSIS — R6883 Chills (without fever): Secondary | ICD-10-CM | POA: Insufficient documentation

## 2018-06-25 DIAGNOSIS — R0602 Shortness of breath: Secondary | ICD-10-CM | POA: Diagnosis present

## 2018-06-25 DIAGNOSIS — R6889 Other general symptoms and signs: Secondary | ICD-10-CM

## 2018-06-25 LAB — BRAIN NATRIURETIC PEPTIDE: B NATRIURETIC PEPTIDE 5: 11.5 pg/mL (ref 0.0–100.0)

## 2018-06-25 LAB — CBC WITH DIFFERENTIAL/PLATELET
Abs Immature Granulocytes: 0.05 10*3/uL (ref 0.00–0.07)
BASOS ABS: 0 10*3/uL (ref 0.0–0.1)
BASOS PCT: 0 %
EOS PCT: 1 %
Eosinophils Absolute: 0.1 10*3/uL (ref 0.0–0.5)
HCT: 46.5 % (ref 39.0–52.0)
Hemoglobin: 15.2 g/dL (ref 13.0–17.0)
Immature Granulocytes: 1 %
Lymphocytes Relative: 23 %
Lymphs Abs: 1.6 10*3/uL (ref 0.7–4.0)
MCH: 31.1 pg (ref 26.0–34.0)
MCHC: 32.7 g/dL (ref 30.0–36.0)
MCV: 95.1 fL (ref 80.0–100.0)
MONO ABS: 0.8 10*3/uL (ref 0.1–1.0)
Monocytes Relative: 11 %
NRBC: 0 % (ref 0.0–0.2)
Neutro Abs: 4.5 10*3/uL (ref 1.7–7.7)
Neutrophils Relative %: 64 %
PLATELETS: 205 10*3/uL (ref 150–400)
RBC: 4.89 MIL/uL (ref 4.22–5.81)
RDW: 12.5 % (ref 11.5–15.5)
WBC: 7 10*3/uL (ref 4.0–10.5)

## 2018-06-25 LAB — BASIC METABOLIC PANEL
Anion gap: 8 (ref 5–15)
BUN: 13 mg/dL (ref 6–20)
CHLORIDE: 102 mmol/L (ref 98–111)
CO2: 22 mmol/L (ref 22–32)
CREATININE: 0.98 mg/dL (ref 0.61–1.24)
Calcium: 8.4 mg/dL — ABNORMAL LOW (ref 8.9–10.3)
Glucose, Bld: 108 mg/dL — ABNORMAL HIGH (ref 70–99)
POTASSIUM: 3.5 mmol/L (ref 3.5–5.1)
SODIUM: 132 mmol/L — AB (ref 135–145)

## 2018-06-25 LAB — TROPONIN I: Troponin I: <0.03 ng/mL (ref <0.03)

## 2018-06-25 NOTE — ED Notes (Signed)
Pt verbalizes understanding of d/c instructions and denies any further needs at this time. 

## 2018-06-25 NOTE — ED Triage Notes (Signed)
C/o flu like sx x 2 days-NAD-steady gait 

## 2018-06-25 NOTE — ED Provider Notes (Signed)
[facility] EMERGENCY DEPARTMENT Provider Note   CSN: [id number] Arrival date & time: 06/25/18  1831     History   Chief Complaint Chief Complaint  Patient presents with  . Cough    HPI Brian Chan is a 58 y.o. male with a 40-year smoking history who presents to the emergency department chief complaint of shortness of breath.  Patient states that 3 days ago he went to work and was working outside in an [employer].  He states that he "just could not get warm."  All day long.  When he got home he was shivering and exhausted and went straight to bed.  He states that since that time he has had intermittent cold chills and shivering.  He is unsure if he was running a fever.  He did take Motrin with some relief of his symptoms.  He states however he has had a slight cough which is nonproductive and now has new exertional dyspnea.  Patient states that he does a lot of walking and carrying heavy things at work and that he has been unable to complete tasks that would normally be easy for him.  He states that walking about 100 feet he gets extremely short of breath.  This is new and is never happened to him before.  He denies any melena or hematochezia.  He denies unilateral leg swelling.  HPI  History reviewed. No pertinent past medical history.  Patient Active Problem List   Diagnosis Date Noted  . Osteoarthritis of carpometacarpal joint of right thumb 06/01/2012  . Right shoulder pain 06/01/2012  . ARM PAIN, LEFT 04/16/2010  . PLANTAR FASCIITIS, LEFT 02/20/2009  . NECK PAIN 07/06/2007  . NONTRAUMATIC RUPTURE EXTENSOR TENDONS HAND&WRIST 07/06/2007  . ELEVATED BP READING WITHOUT DX HYPERTENSION 07/06/2007    History reviewed. No pertinent surgical history.      Home Medications    Prior to Admission medications   Medication Sig Start Date End Date Taking? Authorizing Provider  fluticasone (FLONASE) 50 MCG/ACT nasal spray Place 2 sprays into both nostrils  daily. 03/02/18 04/01/18  Shaune Pollack, MD  ibuprofen (ADVIL,MOTRIN) 200 MG tablet Take 400 mg by mouth as needed.     [provider]    Family History Family History  Problem Relation Age of Onset  . Cancer Mother   . Parkinsonism Father   . Heart attack Other        uncle  . Diabetes Other        grandmother    Social History Social History   Tobacco Use  . Smoking status: Current Every Day Smoker    Packs/day: 1.00    Types: Cigarettes  . Smokeless tobacco: Never Used  Substance Use Topics  . Alcohol use: No  . Drug use: No     Allergies   Codeine and Naproxen sodium   Review of Systems Review of Systems  Ten systems reviewed and are negative for acute change, except as noted in the HPI.   Physical Exam Updated Vital Signs BP (!) 145/101 (BP Location: Left Arm)   Pulse 97   Temp 99.3 F (37.4 C) (Oral)   Resp (!) 26   Ht 5\' 6"  (1.676 m)   Wt 77.1 kg   SpO2 97%   BMI 27.44 kg/m   Physical Exam Vitals signs and nursing note reviewed.  Constitutional:      General: He is not in acute distress.    Appearance: He is well-developed. He is  not diaphoretic.  HENT:     Head: Normocephalic and atraumatic.  Eyes:     General: No scleral icterus.    Conjunctiva/sclera: Conjunctivae normal.  Neck:     Musculoskeletal: Normal range of motion and neck supple.  Cardiovascular:     Rate and Rhythm: Normal rate and regular rhythm.     Heart sounds: Normal heart sounds.  Pulmonary:     Effort: Pulmonary effort is normal. No respiratory distress.     Breath sounds: Normal breath sounds.  Abdominal:     Palpations: Abdomen is soft.     Tenderness: There is no abdominal tenderness.  Musculoskeletal:     Right lower leg: No edema.     Left lower leg: No edema.  Skin:    General: Skin is warm and dry.  Neurological:     Mental Status: He is alert.  Psychiatric:        Behavior: Behavior normal.      ED Treatments / Results  Labs (all labs  ordered are listed, but only abnormal results are displayed) Labs Reviewed  BASIC METABOLIC PANEL  CBC WITH DIFFERENTIAL/PLATELET  BRAIN NATRIURETIC PEPTIDE  TROPONIN I    EKG EKG Interpretation  Date/Time:  Thursday June 25 2018 19:41:43 EST Ventricular Rate:  97 PR Interval:  130 QRS Duration: 84 QT Interval:  340 QTC Calculation: 431 R Axis:   65 Text Interpretation:  Normal sinus rhythm Normal ECG No significant change since last tracing Confirmed by Richardean Canal [phone]) on 06/25/2018 9:05:20 PM   Radiology Dg Chest 2 View  Result Date: 06/25/2018 CLINICAL DATA:  Cough with dyspnea EXAM: CHEST - 2 VIEW COMPARISON:  09/19/2017 FINDINGS: The heart size and mediastinal contours are within normal limits. Both lungs are clear. Stable mild S shaped scoliosis of the thoracolumbar spine. IMPRESSION: No active cardiopulmonary disease. Electronically Signed   By: Tollie Eth M.D.   On: 06/25/2018 18:57    Procedures Procedures (including critical care time)  Medications Ordered in ED Medications - No data to display   Initial Impression / Assessment and Plan / ED Course  I have reviewed the triage vital signs and the nursing notes.  Pertinent labs & imaging results that were available during my care of the patient were reviewed by me and considered in my medical decision making (see chart for details).     Patient with sob in the setting of flu like symptoms.The emergent differential diagnosis for shortness of breath includes, but is not limited to, Pulmonary edema, bronchoconstriction, Pneumonia, Pulmonary embolism, Pneumotherax/ Hemothorax, Dysrythmia, ACS.   He has no tachycardia or hypoxia.  I have no suspicion for pulmonary embolus.  No exertional hypoxia.  EG shows no ischemic changes.  BMP shows slightly elevated blood glucose.  Personally reviewed the patient's chest x-ray which shows no acute abnormalities.  I discussed the case with Dr. Silverio Lay who agrees with discharge and  outpatient follow-up.  Final Clinical Impressions(s) / ED Diagnoses   Final diagnoses:  None    ED Discharge Orders    None       Arthor Captain, PA-C 06/27/18 0013    Charlynne Pander, MD 06/27/18 [phone]

## 2018-06-25 NOTE — Discharge Instructions (Addendum)
You appear to have an upper respiratory infection (URI). An upper respiratory tract infection, or cold, is a viral infection of the air passages leading to the lungs. It is contagious and can be spread to others, especially during the first 3 or 4 days. It cannot be cured by antibiotics or other medicines. °RETURN IMMEDIATELY IF you develop shortness of breath, confusion or altered mental status, a new rash, become dizzy, faint, or poorly responsive, or are unable to be cared for at home. ° °
